# Patient Record
Sex: Female | Born: 1990 | Race: Black or African American | Hispanic: No | Marital: Single | Smoking: Never smoker
Health system: Southern US, Community
[De-identification: ages and names within clinical notes are randomized; demographics above are authoritative.]

## PROBLEM LIST (undated history)

## (undated) ENCOUNTER — Inpatient Hospital Stay (HOSPITAL_COMMUNITY): Payer: Self-pay

## (undated) ENCOUNTER — Ambulatory Visit (HOSPITAL_COMMUNITY): Disposition: A | Discharge status: Home / Self Care | Payer: Medicaid Other

## (undated) DIAGNOSIS — F410 Panic disorder [episodic paroxysmal anxiety] without agoraphobia: Secondary | ICD-10-CM

## (undated) DIAGNOSIS — K219 Gastro-esophageal reflux disease without esophagitis: Secondary | ICD-10-CM

## (undated) DIAGNOSIS — A549 Gonococcal infection, unspecified: Secondary | ICD-10-CM

## (undated) DIAGNOSIS — T8859XA Other complications of anesthesia, initial encounter: Secondary | ICD-10-CM

## (undated) DIAGNOSIS — D649 Anemia, unspecified: Secondary | ICD-10-CM

## (undated) DIAGNOSIS — K5900 Constipation, unspecified: Secondary | ICD-10-CM

## (undated) DIAGNOSIS — F419 Anxiety disorder, unspecified: Secondary | ICD-10-CM

## (undated) HISTORY — PX: WISDOM TOOTH EXTRACTION: SHX21

---
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
## 2009-04-17 ENCOUNTER — Ambulatory Visit (HOSPITAL_COMMUNITY): Admission: RE | Admit: 2009-04-17 | Discharge: 2009-04-17 | Payer: Self-pay | Admitting: Obstetrics

## 2009-09-15 ENCOUNTER — Inpatient Hospital Stay (HOSPITAL_COMMUNITY): Admission: AD | Admit: 2009-09-15 | Discharge: 2009-09-18 | Payer: Self-pay | Admitting: Obstetrics

## 2010-05-07 ENCOUNTER — Emergency Department (HOSPITAL_COMMUNITY)
Admission: EM | Admit: 2010-05-07 | Discharge: 2010-05-07 | Payer: Self-pay | Source: Home / Self Care | Admitting: Emergency Medicine

## 2010-07-13 LAB — CBC
HCT: 30.8 % — ABNORMAL LOW (ref 36.0–46.0)
Hemoglobin: 10.6 g/dL — ABNORMAL LOW (ref 12.0–15.0)
MCHC: 33 g/dL (ref 30.0–36.0)
MCV: 73.7 fL — ABNORMAL LOW (ref 78.0–100.0)
RDW: 20 % — ABNORMAL HIGH (ref 11.5–15.5)
WBC: 13.2 10*3/uL — ABNORMAL HIGH (ref 4.0–10.5)

## 2010-07-17 ENCOUNTER — Inpatient Hospital Stay (HOSPITAL_COMMUNITY)
Admission: RE | Admit: 2010-07-17 | Discharge: 2010-07-17 | Disposition: A | Discharge status: Home / Self Care | Payer: Self-pay | Source: Ambulatory Visit | Attending: Family Medicine | Admitting: Family Medicine

## 2010-08-03 LAB — CBC
HCT: 34 % — ABNORMAL LOW (ref 36.0–46.0)
MCHC: 32.4 g/dL (ref 30.0–36.0)
Platelets: 366 10*3/uL (ref 150–400)

## 2010-08-03 LAB — POCT PREGNANCY, URINE: Preg Test, Ur: POSITIVE

## 2010-08-03 LAB — ABO/RH: ABO/RH(D): A POS

## 2010-08-03 LAB — HCG, QUANTITATIVE, PREGNANCY: hCG, Beta Chain, Quant, S: 43028 m[IU]/mL — ABNORMAL HIGH (ref <5)

## 2010-08-03 LAB — GC/CHLAMYDIA PROBE AMP, URINE: Chlamydia, Swab/Urine, PCR: NEGATIVE

## 2010-08-05 LAB — URINE MICROSCOPIC-ADD ON

## 2010-08-05 LAB — URINALYSIS, ROUTINE W REFLEX MICROSCOPIC
Urobilinogen, UA: 0.2 mg/dL (ref 0.0–1.0)
pH: 6.5 (ref 5.0–8.0)

## 2010-09-26 ENCOUNTER — Emergency Department (HOSPITAL_COMMUNITY)
Admission: EM | Admit: 2010-09-26 | Discharge: 2010-09-26 | Disposition: A | Discharge status: Home / Self Care | Payer: Medicaid Other | Attending: Emergency Medicine | Admitting: Emergency Medicine

## 2010-09-26 DIAGNOSIS — L509 Urticaria, unspecified: Secondary | ICD-10-CM | POA: Insufficient documentation

## 2010-11-30 ENCOUNTER — Emergency Department (HOSPITAL_COMMUNITY)
Admission: EM | Admit: 2010-11-30 | Discharge: 2010-11-30 | Discharge status: Against Medical Advice | Payer: Medicaid Other | Attending: Emergency Medicine | Admitting: Emergency Medicine

## 2010-11-30 DIAGNOSIS — L509 Urticaria, unspecified: Secondary | ICD-10-CM | POA: Insufficient documentation

## 2010-12-02 ENCOUNTER — Emergency Department (HOSPITAL_COMMUNITY)
Admission: EM | Admit: 2010-12-02 | Discharge: 2010-12-02 | Disposition: A | Discharge status: Home / Self Care | Payer: Medicaid Other | Attending: Emergency Medicine | Admitting: Emergency Medicine

## 2010-12-02 DIAGNOSIS — R609 Edema, unspecified: Secondary | ICD-10-CM | POA: Insufficient documentation

## 2010-12-02 DIAGNOSIS — M7989 Other specified soft tissue disorders: Secondary | ICD-10-CM | POA: Insufficient documentation

## 2010-12-02 DIAGNOSIS — M79609 Pain in unspecified limb: Secondary | ICD-10-CM | POA: Insufficient documentation

## 2010-12-02 LAB — POCT I-STAT, CHEM 8
BUN: 7 mg/dL (ref 6–23)
Creatinine, Ser: 0.8 mg/dL (ref 0.50–1.10)
Sodium: 140 mEq/L (ref 135–145)

## 2010-12-03 ENCOUNTER — Ambulatory Visit (HOSPITAL_COMMUNITY)
Admission: RE | Admit: 2010-12-03 | Discharge: 2010-12-03 | Disposition: A | Discharge status: Home / Self Care | Payer: Medicaid Other | Source: Ambulatory Visit | Attending: Emergency Medicine | Admitting: Emergency Medicine

## 2010-12-03 DIAGNOSIS — M79609 Pain in unspecified limb: Secondary | ICD-10-CM

## 2011-01-19 LAB — POCT URINALYSIS DIP (DEVICE)
Glucose, UA: NEGATIVE
Hgb urine dipstick: NEGATIVE
Ketones, ur: NEGATIVE
Nitrite: NEGATIVE
Operator id: 24707
Specific Gravity, Urine: 1.02
pH: 7

## 2011-01-29 LAB — RPR: RPR Ser Ql: NONREACTIVE

## 2011-01-29 LAB — CBC
Hemoglobin: 11.3 g/dL — ABNORMAL LOW (ref 12.0–16.0)
MCHC: 32.6 g/dL (ref 31.0–37.0)
MCV: 79.3 fL (ref 78.0–98.0)
RBC: 4.36 MIL/uL (ref 3.80–5.70)
RDW: 17.6 % — ABNORMAL HIGH (ref 11.4–15.5)
WBC: 15.2 10*3/uL — ABNORMAL HIGH (ref 4.5–13.5)

## 2011-04-30 ENCOUNTER — Emergency Department (HOSPITAL_COMMUNITY)
Admission: EM | Admit: 2011-04-30 | Discharge: 2011-04-30 | Disposition: A | Discharge status: Home / Self Care | Payer: Medicaid Other | Attending: Emergency Medicine | Admitting: Emergency Medicine

## 2011-04-30 ENCOUNTER — Emergency Department (HOSPITAL_COMMUNITY): Payer: Medicaid Other

## 2011-04-30 DIAGNOSIS — M545 Low back pain, unspecified: Secondary | ICD-10-CM | POA: Insufficient documentation

## 2011-04-30 DIAGNOSIS — L299 Pruritus, unspecified: Secondary | ICD-10-CM | POA: Insufficient documentation

## 2011-04-30 DIAGNOSIS — M549 Dorsalgia, unspecified: Secondary | ICD-10-CM

## 2011-04-30 DIAGNOSIS — R21 Rash and other nonspecific skin eruption: Secondary | ICD-10-CM | POA: Insufficient documentation

## 2011-04-30 LAB — URINALYSIS, ROUTINE W REFLEX MICROSCOPIC
Bilirubin Urine: NEGATIVE
Hgb urine dipstick: NEGATIVE

## 2011-04-30 LAB — URINE MICROSCOPIC-ADD ON

## 2011-04-30 LAB — PREGNANCY, URINE: Preg Test, Ur: NEGATIVE

## 2011-04-30 MED ORDER — DIPHENHYDRAMINE HCL 25 MG PO CAPS: 25.0000 mg | ORAL_CAPSULE | Freq: Four times a day (QID) | ORAL | Status: AC | PRN

## 2011-04-30 MED ORDER — DIPHENHYDRAMINE HCL 25 MG PO CAPS
ORAL_CAPSULE | ORAL | Status: AC
  Administered 2011-04-30: 18:00:00
  Filled 2011-04-30: qty 2

## 2011-04-30 MED ORDER — IBUPROFEN 600 MG PO TABS: 600.0000 mg | ORAL_TABLET | Freq: Four times a day (QID) | ORAL | Status: AC | PRN

## 2011-04-30 MED ORDER — HYDROCODONE-ACETAMINOPHEN 5-325 MG PO TABS
1.0000 | ORAL_TABLET | Freq: Once | ORAL | Status: AC
  Administered 2011-04-30: 1 via ORAL
  Filled 2011-04-30: qty 1

## 2011-04-30 MED ORDER — HYDROCODONE-ACETAMINOPHEN 5-500 MG PO TABS: 1.0000 | ORAL_TABLET | ORAL | Status: AC | PRN

## 2011-04-30 MED ORDER — DIPHENHYDRAMINE HCL 25 MG PO CAPS: 50.0000 mg | ORAL_CAPSULE | Freq: Once | ORAL | Status: DC

## 2011-04-30 NOTE — ED Notes (Signed)
Pt ambulatory to check out desk stable and in no acute distress with family. Pt not driving.

## 2011-04-30 NOTE — ED Notes (Signed)
Pt presents with low back pain after falling onto outside steps yesterday.  Pt also reports she broke out in hives yesterday, denies any new food or medication.

## 2011-04-30 NOTE — ED Provider Notes (Signed)
History     CSN: [id number]  Arrival date & time 04/30/11  1348   First MD Initiated Contact with Patient 04/30/11 1651      Chief Complaint  Patient presents with  . Back Pain    (Consider location/radiation/quality/duration/timing/severity/associated sxs/prior treatment) HPI  20yoF is a healthy presents with back pain. The patient complains of back pain since yesterday after her fall. She fell down steps and landed on her bottom, striking her back. She has been ambulatory since that time. She denies numbness, tingling, weakness, pain in her extremities. She complains of 8/10 pain in her lower back in the middle. She also states that yesterday she began to experience red pruritic rash in her bilateral upper extremities and right hip. She denies any new food, medications, exposures. No one at home with similar rash. She denies fevers. Denies hematuria/dysuria/freq/urgency.  History reviewed. No pertinent past medical history.  History reviewed. No pertinent past surgical history.  No family history on file.  History  Substance Use Topics  . Smoking status: Not on file  . Smokeless tobacco: Not on file  . Alcohol Use: Not on file    OB History    Grav Para Term Preterm Abortions TAB SAB Ect Mult Living                  Review of Systems  All other systems reviewed and are negative.   except as noted HPI   Allergies  Codeine  Home Medications   Current Outpatient Rx  Name Route Sig Dispense Refill  . IBUPROFEN 800 MG PO TABS Oral Take 800 mg by mouth every 8 (eight) hours as needed. For pain.     Marland Kitchen DIPHENHYDRAMINE HCL 25 MG PO CAPS Oral Take 1 capsule (25 mg total) by mouth every 6 (six) hours as needed for itching. 30 capsule 0  . HYDROCODONE-ACETAMINOPHEN 5-500 MG PO TABS Oral Take 1 tablet by mouth every 4 (four) hours as needed for pain. 15 tablet 0  . IBUPROFEN 600 MG PO TABS Oral Take 1 tablet (600 mg total) by mouth every 6 (six) hours as needed for pain. 30  tablet 0    BP 105/67  Pulse 93  Temp(Src) 97.4 F (36.3 C) (Oral)  Resp 16  SpO2 100%  Physical Exam  Nursing note and vitals reviewed. Constitutional: She is oriented to person, place, and time. She appears well-developed.  HENT:  Head: Atraumatic.  Mouth/Throat: Oropharynx is clear and moist.  Eyes: Conjunctivae and EOM are normal. Pupils are equal, round, and reactive to light.  Neck: Normal range of motion. Neck supple.  Cardiovascular: Normal rate, regular rhythm, normal heart sounds and intact distal pulses.   Pulmonary/Chest: Effort normal and breath sounds normal. No respiratory distress. She has no wheezes. She has no rales.  Abdominal: Soft. She exhibits no distension. There is no tenderness. There is no rebound and no guarding.       No cvat  Musculoskeletal: Normal range of motion.       Midline lower lumbar tenderness to palpation Strength 5/ 5 bilateral upper and lower extremities  Neurological: She is alert and oriented to person, place, and time. She exhibits normal muscle tone. Coordination normal.  Skin: Skin is warm and dry. No rash noted.       B/l UE with raised red lesions  Psychiatric: She has a normal mood and affect.    ED Course  Procedures (including critical care time)  Labs Reviewed  URINALYSIS, ROUTINE  W REFLEX MICROSCOPIC - Abnormal; Notable for the following:    APPearance CLOUDY (*)    Leukocytes, UA LARGE (*)    All other components within normal limits  URINE MICROSCOPIC-ADD ON - Abnormal; Notable for the following:    Squamous Epithelial / LPF MANY (*)    Bacteria, UA FEW (*)    All other components within normal limits  PREGNANCY, URINE  URINE CULTURE   Dg Lumbar Spine Complete  04/30/2011  *RADIOLOGY REPORT*  Clinical Data: Fall, low back pain  LUMBAR SPINE - COMPLETE 4+ VIEW  Comparison: None.  Findings: Five lumbar-type vertebral bodies.  Normal lumbar lordosis.  No evidence of fracture or dislocation.  Vertebral body heights and  intervertebral disc spaces are maintained.  IMPRESSION: Normal lumbar spine radiographs.  Original Report Authenticated By: Charline Bills, M.D.     1. Back pain   2. Rash      MDM  This was mechanical fall with lower back pain. We'll obtain an x-ray to evaluate. Patient given Vicodin here for pain. Likely home with same and ibuprofen. For rash looks like insect bites. There is no superimposed infection at this time. Patient given Benadryl for return. She does not complaining of UTI symptoms and has no CVA tenderness. Her urinalysis is contaminated. Will send for culture. Hold antibiotics for now.   XR reviewed and unremarkable. Home with plan as above.      Forbes Cellar, MD 04/30/11 [phone]

## 2011-04-30 NOTE — ED Notes (Signed)
Pt reports itching and pain are improved. Pt noted ambulating about hallway. No acute distress is noted.

## 2012-02-06 ENCOUNTER — Emergency Department (HOSPITAL_COMMUNITY)
Admission: EM | Admit: 2012-02-06 | Discharge: 2012-02-06 | Disposition: A | Discharge status: Home / Self Care | Payer: Self-pay | Attending: Emergency Medicine | Admitting: Emergency Medicine

## 2012-02-06 ENCOUNTER — Emergency Department (HOSPITAL_COMMUNITY): Payer: Self-pay

## 2012-02-06 ENCOUNTER — Encounter (HOSPITAL_COMMUNITY): Payer: Self-pay | Admitting: Emergency Medicine

## 2012-02-06 DIAGNOSIS — X500XXA Overexertion from strenuous movement or load, initial encounter: Secondary | ICD-10-CM | POA: Insufficient documentation

## 2012-02-06 DIAGNOSIS — IMO0002 Reserved for concepts with insufficient information to code with codable children: Secondary | ICD-10-CM | POA: Insufficient documentation

## 2012-02-06 DIAGNOSIS — M2390 Unspecified internal derangement of unspecified knee: Secondary | ICD-10-CM

## 2012-02-06 DIAGNOSIS — M25569 Pain in unspecified knee: Secondary | ICD-10-CM | POA: Insufficient documentation

## 2012-02-06 MED ORDER — IBUPROFEN 400 MG PO TABS
800.0000 mg | ORAL_TABLET | Freq: Once | ORAL | Status: AC
  Administered 2012-02-06: 800 mg via ORAL
  Filled 2012-02-06: qty 2

## 2012-02-06 NOTE — ED Notes (Signed)
The pt returned from xray.  Waiting for a disposition

## 2012-02-06 NOTE — ED Notes (Signed)
Pt states she fell on L knee 2 months ago and has had L knee pain since fall.

## 2012-02-06 NOTE — ED Notes (Signed)
Discharge delayed the ortho tech is busy

## 2012-02-06 NOTE — ED Provider Notes (Signed)
History     CSN: [id number]  Arrival date & time 02/06/12  1931   First MD Initiated Contact with Patient 02/06/12 1957      Chief Complaint  Patient presents with  . Knee Pain    (Consider location/radiation/quality/duration/timing/severity/associated sxs/prior treatment) HPI Comments: 21 year old female presents the emergency department complaining of left knee pain after falling on it 2 months ago. She states she was holding her baby, when she slipped on water and "twisted" her knee and fell. Since then she has had constant pain, however she thought it was a pulled muscle. Describes pain as throbbing and sharp at times, rated 8/10 worse with bending and to cross her legs. States she is walking with a limp. Denies any numbness or tingling down her leg. No swelling, bruising or color change  Patient is a 21 y.o. female presenting with knee pain. The history is provided by the patient.  Knee Pain Associated symptoms include arthralgias (left knee pain). Pertinent negatives include no joint swelling, neck pain or numbness.    History reviewed. No pertinent past medical history.  History reviewed. No pertinent past surgical history.  No family history on file.  History  Substance Use Topics  . Smoking status: Never Smoker   . Smokeless tobacco: Not on file  . Alcohol Use: No    OB History    Grav Para Term Preterm Abortions TAB SAB Ect Mult Living                  Review of Systems  Constitutional: Negative for activity change.  HENT: Negative for neck pain.   Musculoskeletal: Positive for arthralgias (left knee pain) and gait problem. Negative for joint swelling.  Skin: Negative for color change.  Neurological: Negative for numbness.    Allergies  Codeine  Home Medications  No current outpatient prescriptions on file.  BP 111/70  Pulse 72  Temp 98.2 F (36.8 C) (Oral)  Resp 16  SpO2 100%  Physical Exam  Nursing note and vitals reviewed. Constitutional:  She is oriented to person, place, and time. She appears well-developed and well-nourished. No distress.  HENT:  Head: Normocephalic and atraumatic.  Eyes: Conjunctivae normal and EOM are normal.  Neck: Normal range of motion.  Cardiovascular: Normal rate, normal heart sounds and intact distal pulses.   Pulmonary/Chest: Effort normal and breath sounds normal.  Musculoskeletal:       Left knee: She exhibits decreased range of motion (very mild), bony tenderness (medially) and abnormal meniscus (positive McMurray's). She exhibits no swelling, no ecchymosis, no deformity and normal alignment. tenderness found. Medial joint line tenderness noted.  Neurological: She is alert and oriented to person, place, and time. She has normal strength. No sensory deficit. Gait (limping) abnormal.  Skin: Skin is warm. No bruising and no ecchymosis noted.  Psychiatric: She has a normal mood and affect. Her behavior is normal.    ED Course  Procedures (including critical care time)  Labs Reviewed - No data to display Dg Knee Complete 4 Views Left  02/06/2012  *RADIOLOGY REPORT*  Clinical Data: The patient fell 2 months ago, hurting the left knee.  Posterior knee pain.  LEFT KNEE - COMPLETE 4+ VIEW  Comparison: None.  Findings: The left knee appears intact. No evidence of acute fracture or subluxation.  No focal bone lesions.  Bone matrix and cortex appear intact.  No abnormal radiopaque densities in the soft tissues.  No significant effusion.  IMPRESSION: No acute bony abnormalities.   Original  Report Authenticated By: Marlon Pel, M.D.      1. Internal derangement of knee       MDM  21 year old female with left knee pain for the past 2 months. She has a positive McMurray sign on her exam concerning meniscal injury. I will provide a knee immobilizer and crutches and have her followup with orthopedics. RICE instructions given.        Trevor Mace, PA-C 02/06/12 2130

## 2012-02-06 NOTE — Progress Notes (Signed)
Orthopedic Tech Progress Note Patient Details:  Jaclyn Tyler 24-Feb-1991 [id number]  Ortho Devices Type of Ortho Device: Knee Immobilizer;Crutches Ortho Device/Splint Location: left knee Ortho Device/Splint Interventions: Application   [name] 02/06/2012, 10:03 PM

## 2012-02-06 NOTE — ED Notes (Signed)
The pt has had pain in her lt knee for 2-3 days.  She injured it  2-3 months ago but no pain until she started working yesterday and today that is when the pain started

## 2012-02-07 NOTE — ED Provider Notes (Signed)
Medical screening examination/treatment/procedure(s) were performed by non-physician practitioner and as supervising physician I was immediately available for consultation/collaboration. Devoria Albe, MD, Armando Gang   Ward Givens, MD 02/07/12 Moses Manners

## 2013-04-20 ENCOUNTER — Emergency Department (HOSPITAL_COMMUNITY)
Admission: EM | Admit: 2013-04-20 | Discharge: 2013-04-20 | Disposition: A | Discharge status: Home / Self Care | Payer: Medicaid Other | Attending: Emergency Medicine | Admitting: Emergency Medicine

## 2013-04-20 ENCOUNTER — Encounter (HOSPITAL_COMMUNITY): Payer: Self-pay | Admitting: Emergency Medicine

## 2013-04-20 DIAGNOSIS — Y990 Civilian activity done for income or pay: Secondary | ICD-10-CM | POA: Insufficient documentation

## 2013-04-20 DIAGNOSIS — Y9389 Activity, other specified: Secondary | ICD-10-CM | POA: Insufficient documentation

## 2013-04-20 DIAGNOSIS — S8990XA Unspecified injury of unspecified lower leg, initial encounter: Secondary | ICD-10-CM | POA: Insufficient documentation

## 2013-04-20 DIAGNOSIS — IMO0002 Reserved for concepts with insufficient information to code with codable children: Secondary | ICD-10-CM | POA: Insufficient documentation

## 2013-04-20 DIAGNOSIS — Y9289 Other specified places as the place of occurrence of the external cause: Secondary | ICD-10-CM | POA: Insufficient documentation

## 2013-04-20 DIAGNOSIS — M25562 Pain in left knee: Secondary | ICD-10-CM

## 2013-04-20 DIAGNOSIS — X500XXA Overexertion from strenuous movement or load, initial encounter: Secondary | ICD-10-CM | POA: Insufficient documentation

## 2013-04-20 DIAGNOSIS — M549 Dorsalgia, unspecified: Secondary | ICD-10-CM

## 2013-04-20 MED ORDER — MELOXICAM 7.5 MG PO TABS: 7.5000 mg | ORAL_TABLET | Freq: Every day | ORAL | Status: DC

## 2013-04-20 NOTE — ED Provider Notes (Signed)
CSN: [id number]     Arrival date & time 04/20/13  0035 History   First MD Initiated Contact with Patient 04/20/13 0046     Chief Complaint  Patient presents with  . Back Pain   (Consider location/radiation/quality/duration/timing/severity/associated sxs/prior Treatment) HPI Comments: Patient is a 22 year old female with history of chronic left knee pain who presents to the emergency department tonight for one week of worsening back pain. She additionally complains of the pain in her left knee. Her back pain is sharp in nature and does not radiate. 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 The pain is worse when she bends over. She has not tried anything for her pain because she "doesn't like pills". She has not tried ice or heat on her back. Her left knee pain is 8 stiffness. She's been seen by orthopedics who recommended arthroscopic surgery. She has declined surgery. She tried a brace with little relief. This is the same pain as her chronic pain for the past 2 years. She denies any fever, chills, numbness, weakness, paresthesias, bowel or bladder incontinence, history of cancer, drug use.  Patient is a 22 y.o. female presenting with back pain. The history is provided by the patient. No language interpreter was used.  Back Pain Associated symptoms: no abdominal pain, no chest pain, no fever, no numbness and no weakness     History reviewed. No pertinent past medical history. History reviewed. No pertinent past surgical history. History reviewed. No pertinent family history. History  Substance Use Topics  . Smoking status: Never Smoker   . Smokeless tobacco: Not on file  . Alcohol Use: Yes   OB History   Grav Para Term Preterm Abortions TAB SAB Ect Mult Living                 Review of Systems  Constitutional: Negative for fever and chills.  Respiratory: Negative for shortness of breath.   Cardiovascular: Negative for chest pain.    Gastrointestinal: Negative for nausea, vomiting and abdominal pain.  Musculoskeletal: Positive for arthralgias, back pain, gait problem, joint swelling and myalgias.  Neurological: Negative for weakness and numbness.  All other systems reviewed and are negative.    Allergies  Codeine  Home Medications   Current Outpatient Rx  Name  Route  Sig  Dispense  Refill  . meloxicam (MOBIC) 7.5 MG tablet   Oral   Take 1 tablet (7.5 mg total) by mouth daily.   15 tablet   0    BP 115/63  Pulse 90  Temp(Src) 98.2 F (36.8 C) (Oral)  Resp 18  SpO2 100% Physical Exam  Nursing note and vitals reviewed. Constitutional: She is oriented to person, place, and time. She appears well-developed and well-nourished. No distress.  HENT:  Head: Normocephalic and atraumatic.  Right Ear: External ear normal.  Left Ear: External ear normal.  Nose: Nose normal.  Mouth/Throat: Oropharynx is clear and moist.  Eyes: Conjunctivae are normal.  Neck: Normal range of motion.  Cardiovascular: Normal rate, regular rhythm, normal heart sounds, intact distal pulses and normal pulses.   Pulses:      Dorsalis pedis pulses are 2+ on the right side, and 2+ on the left side.       Posterior tibial pulses are 2+ on the right side, and 2+ on the left side.  Pulmonary/Chest: Effort normal and breath sounds normal. No stridor. No respiratory distress. She has no wheezes. She has no rales.  Abdominal: Soft. She exhibits no distension.  Musculoskeletal: Normal range of motion.       Back:  straight leg raise negative bilaterally. Range of motion of left knee limited due to pain. Diffusely tender over left knee. No erythema, swelling. Compartment soft, neurovascularly intact.  Neurological: She is alert and oriented to person, place, and time. She has normal strength.  Skin: Skin is warm and dry. She is not diaphoretic. No erythema.  Psychiatric: She has a normal mood and affect. Her behavior is normal.    ED  Course  Procedures (including critical care time) Labs Review Labs Reviewed - No data to display Imaging Review No results found.  EKG Interpretation   None       MDM   1. Back pain   2. Left knee pain    Patient with back pain.  No neurological deficits and normal neuro exam.  Patient can walk but states is painful.  No loss of bowel or bladder control.  No concern for cauda equina.  No fever, night sweats, weight loss, h/o cancer, IVDU.  RICE protocol and pain medicine indicated and discussed with patient.      Mora Bellman, PA-C 04/20/13 [phone]

## 2013-04-20 NOTE — ED Notes (Signed)
Pt reports left knee pain x 1.5 yrs. Was seen in the ED and referred to an orthopedist and was told that she needed surgery but chose not to have it due to not wanting scars on her knee. Pt reports lower back pain x 1 week. Reports lifting heavy objects at work and straining her back.

## 2013-04-20 NOTE — ED Provider Notes (Signed)
Medical screening examination/treatment/procedure(s) were performed by non-physician practitioner and as supervising physician I was immediately available for consultation/collaboration.     Brandt Loosen, MD 04/20/13 [phone]

## 2013-04-20 NOTE — ED Notes (Signed)
Worsening back pain while at work. X 1 week of back pain. Knee pain for a year.

## 2013-05-09 ENCOUNTER — Inpatient Hospital Stay (HOSPITAL_COMMUNITY): Payer: Medicaid Other

## 2013-05-09 ENCOUNTER — Encounter (HOSPITAL_COMMUNITY): Payer: Self-pay | Admitting: *Deleted

## 2013-05-09 ENCOUNTER — Inpatient Hospital Stay (HOSPITAL_COMMUNITY)
Admission: AD | Admit: 2013-05-09 | Discharge: 2013-05-09 | Disposition: A | Discharge status: Home / Self Care | Payer: Medicaid Other | Source: Ambulatory Visit | Attending: Obstetrics and Gynecology | Admitting: Obstetrics and Gynecology

## 2013-05-09 DIAGNOSIS — O200 Threatened abortion: Secondary | ICD-10-CM | POA: Insufficient documentation

## 2013-05-09 DIAGNOSIS — R109 Unspecified abdominal pain: Secondary | ICD-10-CM | POA: Insufficient documentation

## 2013-05-09 DIAGNOSIS — O209 Hemorrhage in early pregnancy, unspecified: Secondary | ICD-10-CM

## 2013-05-09 HISTORY — DX: Gonococcal infection, unspecified: A54.9

## 2013-05-09 HISTORY — DX: Constipation, unspecified: K59.00

## 2013-05-09 LAB — WET PREP, GENITAL
Clue Cells Wet Prep HPF POC: NONE SEEN
Trich, Wet Prep: NONE SEEN
YEAST WET PREP: NONE SEEN

## 2013-05-09 LAB — CBC
HCT: 34.2 % — ABNORMAL LOW (ref 36.0–46.0)
Hemoglobin: 10.9 g/dL — ABNORMAL LOW (ref 12.0–15.0)
MCH: 21.5 pg — ABNORMAL LOW (ref 26.0–34.0)
MCHC: 31.9 g/dL (ref 30.0–36.0)
MCV: 67.5 fL — AB (ref 78.0–100.0)
PLATELETS: 427 10*3/uL — AB (ref 150–400)
RBC: 5.07 MIL/uL (ref 3.87–5.11)
RDW: 20.1 % — ABNORMAL HIGH (ref 11.5–15.5)
WBC: 15.5 10*3/uL — AB (ref 4.0–10.5)

## 2013-05-09 LAB — POCT PREGNANCY, URINE: PREG TEST UR: POSITIVE — AB

## 2013-05-09 LAB — URINALYSIS, ROUTINE W REFLEX MICROSCOPIC
BILIRUBIN URINE: NEGATIVE
Glucose, UA: NEGATIVE mg/dL
Ketones, ur: NEGATIVE mg/dL
Leukocytes, UA: NEGATIVE
NITRITE: NEGATIVE
PH: 7 (ref 5.0–8.0)
Protein, ur: NEGATIVE mg/dL
SPECIFIC GRAVITY, URINE: 1.01 (ref 1.005–1.030)
Urobilinogen, UA: 0.2 mg/dL (ref 0.0–1.0)

## 2013-05-09 LAB — URINE MICROSCOPIC-ADD ON

## 2013-05-09 LAB — HCG, QUANTITATIVE, PREGNANCY: HCG, BETA CHAIN, QUANT, S: 514 m[IU]/mL — AB (ref <5)

## 2013-05-09 LAB — ABO/RH: ABO/RH(D): A POS

## 2013-05-09 NOTE — MAU Note (Signed)
Pt presents with complaints of vaginal bleeding with some lower abdominal cramping and she had a positive pregnancy test 2 or 3 weeks ago

## 2013-05-09 NOTE — Discharge Instructions (Signed)
Vaginal Bleeding During Pregnancy, First Trimester °A small amount of bleeding (spotting) from the vagina is relatively common in early pregnancy. It usually stops on its own. Various things may cause bleeding or spotting in early pregnancy. Some bleeding may be related to the pregnancy, and some may not. In most cases, the bleeding is normal and is not a problem. However, bleeding can also be a sign of something serious. Be sure to tell your health care provider about any vaginal bleeding right away. °Some possible causes of vaginal bleeding during the first trimester include: °· Infection or inflammation of the cervix. °· Growths (polyps) on the cervix. °· Miscarriage or threatened miscarriage. °· Pregnancy tissue has developed outside of the uterus and in a fallopian tube (tubal pregnancy). °· Tiny cysts have developed in the uterus instead of pregnancy tissue (molar pregnancy). °HOME CARE INSTRUCTIONS  °Watch your condition for any changes. The following actions may help to lessen any discomfort you are feeling: °· Follow your health care provider's instructions for limiting your activity. If your health care provider orders bed rest, you may need to stay in bed and only get up to use the bathroom. However, your health care provider may allow you to continue light activity. °· If needed, make plans for someone to help with your regular activities and responsibilities while you are on bed rest. °· Keep track of the number of pads you use each day, how often you change pads, and how soaked (saturated) they are. Write this down. °· Do not use tampons. Do not douche. °· Do not have sexual intercourse or orgasms until approved by your health care provider. °· If you pass any tissue from your vagina, save the tissue so you can show it to your health care provider. °· Only take over-the-counter or prescription medicines as directed by your health care provider. °· Do not take aspirin because it can make you  bleed. °· Keep all follow-up appointments as directed by your health care provider. °SEEK MEDICAL CARE IF: °· You have any vaginal bleeding during any part of your pregnancy. °· You have cramps or labor pains. °SEEK IMMEDIATE MEDICAL CARE IF:  °· You have severe cramps in your back or belly (abdomen). °· You have a fever, not controlled by medicine. °· You pass large clots or tissue from your vagina. °· Your bleeding increases. °· You feel lightheaded or weak, or you have fainting episodes. °· You have chills. °· You are leaking fluid or have a gush of fluid from your vagina. °· You pass out while having a bowel movement. °MAKE SURE YOU: °· Understand these instructions. °· Will watch your condition. °· Will get help right away if you are not doing well or get worse. °Document Released: 01/20/2005 Document Revised: 01/31/2013 Document Reviewed: 12/18/2012 °[employer]® Patient Information ©2014 [employer]. ° °

## 2013-05-09 NOTE — MAU Provider Note (Signed)
History     CSN: [id number]  Arrival date and time: 05/09/13 [id number]   First Provider Initiated Contact with Patient 05/09/13 [id number]      Chief Complaint  Patient presents with  . Vaginal Bleeding   HPI  Jaclyn Tyler is a [age] yof [id number] who presents to the MAU for vaginal bleeding that onset yesterday.  Pt states that while using the bathroom at work and noticed blood on the toilet tissue after she wiped.  She went home later that night and the vaginal bleed had gotten worse and began to run down her leg.  States the blood was dark red and contained small clots.  She reports associated constant, lower abdominal pain that feels like menstrual cramping that onset this am.  States that she took about 5 different pregnancy tests in the last 6 weeks, the last being 3 weeks ago, that were all positive.    OB History   Grav Para Term Preterm Abortions TAB SAB Ect Mult Living   4 2 2  1  1   2       Past Medical History  Diagnosis Date  . Gonorrhea   . Constipation     Past Surgical History  Procedure Laterality Date  . Wisdom tooth extraction      History reviewed. No pertinent family history.  History  Substance Use Topics  . Smoking status: Never Smoker   . Smokeless tobacco: Not on file  . Alcohol Use: Yes    Allergies:  Allergies  Allergen Reactions  . Codeine Hives, Itching and Rash    Prescriptions prior to admission  Medication Sig Dispense Refill  . meloxicam (MOBIC) 7.5 MG tablet Take 1 tablet (7.5 mg total) by mouth daily.  15 tablet  0    Review of Systems  Constitutional: Negative for fever and chills.  Gastrointestinal: Positive for abdominal pain. Negative for nausea, vomiting, diarrhea and constipation.  Genitourinary: Negative for dysuria, urgency and frequency.   Physical Exam   Blood pressure 149/77, pulse 89, temperature 97.9 F (36.6 C), resp. rate 16, height 6' (1.829 m), weight 102.967 kg (227 lb), last menstrual period  03/23/2013.  Physical Exam  Constitutional: She appears well-developed and well-nourished.  GI: Soft. There is tenderness in the right lower quadrant and left lower quadrant.  Genitourinary:  Speculum exam:  Vagina: Blood (moderate) pooling, no erythema, no lesions noted.  Cervix:  Actively bleeding from the os. No erythema, no lesions noted externally.  Bi-manual exam  Cervix:  No CMT, os open (fingertip) Uterus:  No tenderness noted Adnexal: No tenderness bilaterally Wet prep and GC/Chlam collected. Chaperone present for the exam.    MAU Course  Procedures  [place] Ob Comp Less 14 Wks  05/09/2013   CLINICAL DATA:  Pregnant, vaginal bleeding; no quantitative beta HCG available for correlation  EXAM: OBSTETRIC <14 WK US AND TRANSVAGINAL OB US  TECHNIQUE: Both transabdominal and transvaginal ultrasound examinations were performed for complete evaluation of the gestation as well as the maternal uterus, adnexal regions, and pelvic cul-de-sac. Transvaginal technique was performed to assess early pregnancy.  COMPARISON:  None for this pregnancy  FINDINGS: Intrauterine gestational sac: Not identified  Yolk sac:  N/A  Embryo:  N/A  Cardiac Activity: N/A  Heart Rate:  N/A bpm  Maternal uterus/adnexae:  No focal uterine mass, fluid collection or gestational sac seen.  Left ovary normal size and morphology 1.6 x 2.1 x 2.4 cm.  Right ovary not visualized on either transabdominal or endovaginal  imaging, question related to obscuration by bowel.  Trace free pelvic fluid.  No adnexal masses.  IMPRESSION: No intrauterine or extrauterine gestational sac identified.  In the setting of a positive pregnancy test and nonvisualization of an intrauterine pregnancy, differential diagnosis includes early intrauterine pregnancy too early to visualize, spontaneous abortion, and ectopic pregnancy.  Serial quantitative beta HCG and or follow-up ultrasound recommended to definitively exclude ectopic pregnancy.    Electronically Signed   By: Ulyses Southward M.D.   On: 05/09/2013 11:27   US Ob Transvaginal  05/09/2013   CLINICAL DATA:  Pregnant, vaginal bleeding; no quantitative beta HCG available for correlation  EXAM: OBSTETRIC <14 WK [place] AND TRANSVAGINAL OB US  TECHNIQUE: Both transabdominal and transvaginal ultrasound examinations were performed for complete evaluation of the gestation as well as the maternal uterus, adnexal regions, and pelvic cul-de-sac. Transvaginal technique was performed to assess early pregnancy.  COMPARISON:  None for this pregnancy  FINDINGS: Intrauterine gestational sac: Not identified  Yolk sac:  N/A  Embryo:  N/A  Cardiac Activity: N/A  Heart Rate:  N/A bpm  Maternal uterus/adnexae:  No focal uterine mass, fluid collection or gestational sac seen.  Left ovary normal size and morphology 1.6 x 2.1 x 2.4 cm.  Right ovary not visualized on either transabdominal or endovaginal imaging, question related to obscuration by bowel.  Trace free pelvic fluid.  No adnexal masses.  IMPRESSION: No intrauterine or extrauterine gestational sac identified.  In the setting of a positive pregnancy test and nonvisualization of an intrauterine pregnancy, differential diagnosis includes early intrauterine pregnancy too early to visualize, spontaneous abortion, and ectopic pregnancy.  Serial quantitative beta HCG and or follow-up ultrasound recommended to definitively exclude ectopic pregnancy.   Electronically Signed   By: Ulyses Southward M.D.   On: 05/09/2013 11:27    MDM  Speculum exam Bi-manual exam CBC ABO blood type US OB complete less than 14 wks US OB transvaginal HCG quantitative   Assessment and Plan   Assessment:  #Vaginal bleeding in pregnant patient at less than [redacted] weeks gestation-The obstetric differential includes ectopic pregnancy, spontaneous abortion and early pregnancy; too early to be visualized on ultrasound.  As all of the diagnoses are possible it is imperative that the patient  returns in 48 hours to have a quantitative hCG taken to assess which diagnosis will be more likely.  If the quantitative hCG has nearly doubled in 48 hrs a normal pregnancy too early for visualization is likely, if the quantitative hCG remains the same or lowers ectopic pregnancy and spontaneous abortion is likely and will warrant further work up.  Gynecological differential includes infection and/or mechanical stress in the vagina.  With no white count, negative wet prep and a uterus source for the vaginal bleeding found on speculum exam these etiologies are unlikely.  Plan:  1. Discharge patient in stable condition 2. Follow up in 48 hrs to have quantitative hCG rechecked 3. Return to the MAU if symptoms persist and/or worsen   Toilolo, Tifi 05/09/2013, 9:35 AM   I have seen this patient and agree with the above resident's note.  LEFTWICH-KIRBY, Deneisha Dade Certified Nurse-Midwife

## 2013-05-10 LAB — GC/CHLAMYDIA PROBE AMP
CT PROBE, AMP APTIMA: NEGATIVE
GC Probe RNA: NEGATIVE

## 2013-05-11 ENCOUNTER — Encounter (HOSPITAL_COMMUNITY): Payer: Self-pay | Admitting: *Deleted

## 2013-05-11 ENCOUNTER — Inpatient Hospital Stay (HOSPITAL_COMMUNITY)
Admission: AD | Admit: 2013-05-11 | Discharge: 2013-05-11 | Disposition: A | Discharge status: Home / Self Care | Payer: Medicaid Other | Source: Ambulatory Visit | Attending: Obstetrics | Admitting: Obstetrics

## 2013-05-11 DIAGNOSIS — O039 Complete or unspecified spontaneous abortion without complication: Secondary | ICD-10-CM | POA: Insufficient documentation

## 2013-05-11 LAB — HCG, QUANTITATIVE, PREGNANCY: hCG, Beta Chain, Quant, S: 88 m[IU]/mL — ABNORMAL HIGH (ref <5)

## 2013-05-11 LAB — CBC
HCT: 32.6 % — ABNORMAL LOW (ref 36.0–46.0)
HEMOGLOBIN: 10.1 g/dL — AB (ref 12.0–15.0)
MCH: 21.3 pg — AB (ref 26.0–34.0)
MCHC: 31 g/dL (ref 30.0–36.0)
MCV: 68.8 fL — ABNORMAL LOW (ref 78.0–100.0)
PLATELETS: 386 10*3/uL (ref 150–400)
RBC: 4.74 MIL/uL (ref 3.87–5.11)
RDW: 18.5 % — ABNORMAL HIGH (ref 11.5–15.5)
WBC: 12.1 10*3/uL — AB (ref 4.0–10.5)

## 2013-05-11 NOTE — MAU Provider Note (Signed)
  History     CSN: [id number]  Arrival date and time: 05/11/13 [id number]   First Provider Initiated Contact with Patient 05/11/13 1904      No chief complaint on file.  HPI Comments: Jaclyn Tyler 23 y.o. [id number] presents to MAU for follow up on her BHCG. She was 514 on 05/09/13. She has been bleeding heavily since then. States she is changing pad x times per hour. She is also passing large clots. She declines anything for pain as she does not like to take medications. She has not seen Dr Gaynell FaceMarshall in several years.      Past Medical History  Diagnosis Date  . Gonorrhea   . Constipation     Past Surgical History  Procedure Laterality Date  . Wisdom tooth extraction      History reviewed. No pertinent family history.  History  Substance Use Topics  . Smoking status: Never Smoker   . Smokeless tobacco: Not on file  . Alcohol Use: Yes    Allergies: No Known Allergies  No prescriptions prior to admission    Review of Systems  Respiratory: Negative.   Cardiovascular: Negative.   Gastrointestinal: Negative.   Genitourinary:       Uterus pain and bleeding  Musculoskeletal: Negative.   Neurological: Negative.   Psychiatric/Behavioral: Negative.    Physical Exam   Blood pressure 128/73, pulse 74, temperature 99.1 F (37.3 C), temperature source Oral, resp. rate 18, height 5\' 10"  (1.778 m), weight 103.874 kg (229 lb), last menstrual period 03/23/2013.  Physical Exam  Constitutional: She is oriented to person, place, and time. She appears well-developed and well-nourished. No distress.  HENT:  Head: Normocephalic and atraumatic.  Genitourinary:  Genitalia: External; Negative Vaginal: small amount blood Cervix: open finger tip Biman: Uterus tender  Neurological: She is alert and oriented to person, place, and time.  Skin: Skin is warm.  Psychiatric: She has a normal mood and affect.   Results for orders placed during the hospital encounter of 05/11/13 (from the  past 24 hour(s))  HCG, QUANTITATIVE, PREGNANCY     Status: Abnormal   Collection Time    05/11/13  6:30 PM      Result Value Range   hCG, Beta Chain, Quant, S 88 (*) <5 mIU/mL  CBC     Status: Abnormal   Collection Time    05/11/13  6:30 PM      Result Value Range   WBC 12.1 (*) 4.0 - 10.5 K/uL   RBC 4.74  3.87 - 5.11 MIL/uL   Hemoglobin 10.1 (*) 12.0 - 15.0 g/dL   HCT 29.532.6 (*) 62.136.0 - 30.846.0 %   MCV 68.8 (*) 78.0 - 100.0 fL   MCH 21.3 (*) 26.0 - 34.0 pg   MCHC 31.0  30.0 - 36.0 g/dL   RDW 65.718.5 (*) 84.611.5 - 96.215.5 %   Platelets 386  150 - 400 K/uL     MAU Course  Procedures  MDM Repeat BHCG  Assessment and Plan   A: SAB  P: Pt declines any pain medication Note to be out of work tomorrow Repeat BHCG on Sunday Advised pelvic rest  Jaclyn Tyler, Jaclyn Tyler 05/11/2013, 7:20 PM

## 2013-05-11 NOTE — MAU Note (Signed)
Was here a couple days ago, was to return for blood work  This morning.  Was too tired when got off work so went home.  Feels weak and light headed.

## 2013-05-11 NOTE — Discharge Instructions (Signed)
Miscarriage  A miscarriage is the loss of an unborn baby (fetus) before the 20th week of pregnancy. The cause is often unknown.   HOME CARE  · You may need to stay in bed (bed rest), or you may be able to do light activity. Go about activity as told by your doctor.  · Have help at home.  · Write down how many pads you use each day. Write down how soaked they are.  · Do not use tampons. Do not wash out your vagina (douche) or have sex (intercourse) until your doctor approves.  · Only take medicine as told by your doctor.  · Do not take aspirin.  · Keep all doctor visits as told.  · If you or your partner have problems with grieving, talk to your doctor. You can also try counseling. Give yourself time to grieve before trying to get pregnant again.  GET HELP RIGHT AWAY IF:  · You have bad cramps or pain in your back or belly (abdomen).  · You have a fever.  · You pass large clumps of blood (clots) from your vagina that are walnut-sized or larger. Save the clumps for your doctor to see.  · You pass large amounts of tissue from your vagina. Save the tissue for your doctor to see.  · You have more bleeding.  · You have thick, bad-smelling fluid (discharge) coming from the vagina.  · You get lightheaded, weak, or you pass out (faint).  · You have chills.  MAKE SURE YOU:  · Understand these instructions.  · Will watch your condition.  · Will get help right away if you are not doing well or get worse.  Document Released: 07/05/2011 Document Reviewed: 07/05/2011  ExitCare® Patient Information ©2014 [employer].

## 2013-05-17 NOTE — MAU Provider Note (Signed)
Attestation of Attending Supervision of Advanced Practitioner (CNM/NP): Evaluation and management procedures were performed by the Advanced Practitioner under my supervision and collaboration.  I have reviewed the Advanced Practitioner's note and chart, and I agree with the management and plan.  Staton Markey 05/17/2013 8:29 AM   

## 2013-06-05 ENCOUNTER — Inpatient Hospital Stay (HOSPITAL_COMMUNITY)
Admission: AD | Admit: 2013-06-05 | Discharge: 2013-06-05 | Disposition: A | Discharge status: Home / Self Care | Payer: Medicaid Other | Source: Ambulatory Visit | Attending: Obstetrics | Admitting: Obstetrics

## 2013-06-05 ENCOUNTER — Encounter (HOSPITAL_COMMUNITY): Payer: Self-pay | Admitting: *Deleted

## 2013-06-05 DIAGNOSIS — R109 Unspecified abdominal pain: Secondary | ICD-10-CM | POA: Insufficient documentation

## 2013-06-05 DIAGNOSIS — A088 Other specified intestinal infections: Secondary | ICD-10-CM

## 2013-06-05 DIAGNOSIS — K5289 Other specified noninfective gastroenteritis and colitis: Secondary | ICD-10-CM | POA: Insufficient documentation

## 2013-06-05 DIAGNOSIS — R42 Dizziness and giddiness: Secondary | ICD-10-CM | POA: Insufficient documentation

## 2013-06-05 DIAGNOSIS — A084 Viral intestinal infection, unspecified: Secondary | ICD-10-CM

## 2013-06-05 HISTORY — DX: Anemia, unspecified: D64.9

## 2013-06-05 LAB — URINALYSIS, ROUTINE W REFLEX MICROSCOPIC
Bilirubin Urine: NEGATIVE
GLUCOSE, UA: NEGATIVE mg/dL
HGB URINE DIPSTICK: NEGATIVE
KETONES UR: NEGATIVE mg/dL
Leukocytes, UA: NEGATIVE
Nitrite: NEGATIVE
Protein, ur: NEGATIVE mg/dL
Specific Gravity, Urine: 1.025 (ref 1.005–1.030)
Urobilinogen, UA: 1 mg/dL (ref 0.0–1.0)
pH: 6 (ref 5.0–8.0)

## 2013-06-05 LAB — HCG, QUANTITATIVE, PREGNANCY: hCG, Beta Chain, Quant, S: 1 m[IU]/mL (ref <5)

## 2013-06-05 LAB — CBC
HEMATOCRIT: 35.6 % — AB (ref 36.0–46.0)
HEMOGLOBIN: 11.3 g/dL — AB (ref 12.0–15.0)
MCH: 21.3 pg — AB (ref 26.0–34.0)
MCHC: 31.7 g/dL (ref 30.0–36.0)
MCV: 67.2 fL — AB (ref 78.0–100.0)
Platelets: 377 10*3/uL (ref 150–400)
RBC: 5.3 MIL/uL — ABNORMAL HIGH (ref 3.87–5.11)
RDW: 17.8 % — ABNORMAL HIGH (ref 11.5–15.5)
WBC: 15.8 10*3/uL — ABNORMAL HIGH (ref 4.0–10.5)

## 2013-06-05 LAB — POCT PREGNANCY, URINE: PREG TEST UR: NEGATIVE

## 2013-06-05 MED ORDER — PROMETHAZINE HCL 12.5 MG PO TABS: 12.5000 mg | ORAL_TABLET | Freq: Four times a day (QID) | ORAL | Status: DC | PRN

## 2013-06-05 MED ORDER — PROMETHAZINE HCL 25 MG PO TABS
25.0000 mg | ORAL_TABLET | Freq: Once | ORAL | Status: AC
  Administered 2013-06-05: 25 mg via ORAL
  Filled 2013-06-05: qty 1

## 2013-06-05 NOTE — Discharge Instructions (Signed)
Diet for Diarrhea, Adult  Frequent, runny stools (diarrhea) may be caused or worsened by food or drink. Diarrhea may be relieved by changing your diet. Since diarrhea can last up to 7 days, it is easy for you to lose too much fluid from the body and become dehydrated. Fluids that are lost need to be replaced. Along with a modified diet, make sure you drink enough fluids to keep your urine clear or pale yellow.  DIET INSTRUCTIONS  · Ensure adequate fluid intake (hydration): have 1 cup (8 oz) of fluid for each diarrhea episode. Avoid fluids that contain simple sugars or sports drinks, fruit juices, whole milk products, and sodas. Your urine should be clear or pale yellow if you are drinking enough fluids. Hydrate with an oral rehydration solution that you can purchase at pharmacies, retail stores, and online. You can prepare an oral rehydration solution at home by mixing the following ingredients together:  ·   tsp table salt.  · ¾ tsp baking soda.  ·  tsp salt substitute containing potassium chloride.  · 1  tablespoons sugar.  · 1 L (34 oz) of water.  · Certain foods and beverages may increase the speed at which food moves through the gastrointestinal (GI) tract. These foods and beverages should be avoided and include:  · Caffeinated and alcoholic beverages.  · High-fiber foods, such as raw fruits and vegetables, nuts, seeds, and whole grain breads and cereals.  · Foods and beverages sweetened with sugar alcohols, such as xylitol, sorbitol, and mannitol.  · Some foods may be well tolerated and may help thicken stool including:  · Starchy foods, such as rice, toast, pasta, low-sugar cereal, oatmeal, grits, baked potatoes, crackers, and bagels.    · Bananas.    · Applesauce.  · Add probiotic-rich foods to help increase healthy bacteria in the GI tract, such as yogurt and fermented milk products.  RECOMMENDED FOODS AND BEVERAGES  Starches  Choose foods with less than 2 g of fiber per serving.  · Recommended:  White,  French, and pita breads, plain rolls, buns, bagels. Plain muffins, matzo. Soda, saltine, or graham crackers. Pretzels, melba toast, zwieback. Cooked cereals made with water: cornmeal, farina, cream cereals. Dry cereals: refined corn, wheat, rice. Potatoes prepared any way without skins, refined macaroni, spaghetti, noodles, refined rice.  · Avoid:  Bread, rolls, or crackers made with whole wheat, multi-grains, rye, bran seeds, nuts, or coconut. Corn tortillas or taco shells. Cereals containing whole grains, multi-grains, bran, coconut, nuts, raisins. Cooked or dry oatmeal. Coarse wheat cereals, granola. Cereals advertised as "high-fiber." Potato skins. Whole grain pasta, wild or brown rice. Popcorn. Sweet potatoes, yams. Sweet rolls, doughnuts, waffles, pancakes, sweet breads.  Vegetables  · Recommended: Strained tomato and vegetable juices. Most well-cooked and canned vegetables without seeds. Fresh: Tender lettuce, cucumber without the skin, cabbage, spinach, bean sprouts.  · Avoid: Fresh, cooked, or canned: Artichokes, baked beans, beet greens, broccoli, Brussels sprouts, corn, kale, legumes, peas, sweet potatoes. Cooked: Green or red cabbage, spinach. Avoid large servings of any vegetables because vegetables shrink when cooked, and they contain more fiber per serving than fresh vegetables.  Fruit  · Recommended: Cooked or canned: Apricots, applesauce, cantaloupe, cherries, fruit cocktail, grapefruit, grapes, kiwi, mandarin oranges, peaches, pears, plums, watermelon. Fresh: Apples without skin, ripe banana, grapes, cantaloupe, cherries, grapefruit, peaches, oranges, plums. Keep servings limited to ½ cup or 1 piece.  · Avoid: Fresh: Apples with skin, apricots, mangoes, pears, raspberries, strawberries. Prune juice, stewed or dried prunes. Dried   fruits, raisins, dates. Large servings of all fresh fruits.  Protein  · Recommended: Ground or well-cooked tender beef, ham, veal, lamb, pork, or poultry. Eggs. Fish,  oysters, shrimp, lobster, other seafoods. Liver, organ meats.  · Avoid: Tough, fibrous meats with gristle. Peanut butter, smooth or chunky. Cheese, nuts, seeds, legumes, dried peas, beans, lentils.  Dairy  · Recommended: Yogurt, lactose-free milk, kefir, drinkable yogurt, buttermilk, soy milk, or plain hard cheese.  · Avoid: Milk, chocolate milk, beverages made with milk, such as milkshakes.  Soups  · Recommended: Bouillon, broth, or soups made from allowed foods. Any strained soup.  · Avoid: Soups made from vegetables that are not allowed, cream or milk-based soups.  Desserts and Sweets  · Recommended: Sugar-free gelatin, sugar-free frozen ice pops made without sugar alcohol.  · Avoid: Plain cakes and cookies, pie made with fruit, pudding, custard, cream pie. Gelatin, fruit, ice, sherbet, frozen ice pops. Ice cream, ice milk without nuts. Plain hard candy, honey, jelly, molasses, syrup, sugar, chocolate syrup, gumdrops, marshmallows.  Fats and Oils  · Recommended: Limit fats to less than 8 tsp per day.  · Avoid: Seeds, nuts, olives, avocados. Margarine, butter, cream, mayonnaise, salad oils, plain salad dressings. Plain gravy, crisp bacon without rind.  Beverages  · Recommended: Water, decaffeinated teas, oral rehydration solutions, sugar-free beverages not sweetened with sugar alcohols.  · Avoid: Fruit juices, caffeinated beverages (coffee, tea, soda), alcohol, sports drinks, or lemon-lime soda.  Condiments  · Recommended: Ketchup, mustard, horseradish, vinegar, cocoa powder. Spices in moderation: allspice, basil, bay leaves, celery powder or leaves, cinnamon, cumin powder, curry powder, ginger, mace, marjoram, onion or garlic powder, oregano, paprika, parsley flakes, ground pepper, rosemary, sage, savory, tarragon, thyme, turmeric.  · Avoid: Coconut, honey.  Document Released: 07/03/2003 Document Revised: 01/05/2012 Document Reviewed: 08/27/2011  ExitCare® Patient Information ©2014 [employer].

## 2013-06-05 NOTE — MAU Note (Signed)
Pt not in lobby.  

## 2013-06-05 NOTE — MAU Note (Signed)
Pt states upper abd pain x all day today, nauseated but no vomiting. Feels lightheaded. Sitting in a wheelchair.

## 2013-06-05 NOTE — MAU Provider Note (Signed)
History     CSN: [id number]  Arrival date and time: 06/05/13 4098   First Provider Initiated Contact with Patient 06/05/13 2145      Chief Complaint  Patient presents with  . Abdominal Pain   Abdominal Pain    Jaclyn Tyler is 23 y.o. [phone] who presents today with abdominal pain, lightheadedness and nausea. She denies any vomiting/diarrhea, fever or close contacts who have been sick. She denies any vaginal bleeding or vaginal discharge. She had a miscarriage in January, and has not had her period return since then. She has had unprotected intercourse since then. She has not called her doctors office about her symptoms today.   Past Medical History  Diagnosis Date  . Gonorrhea   . Constipation   . Anemia     Past Surgical History  Procedure Laterality Date  . Wisdom tooth extraction      History reviewed. No pertinent family history.  History  Substance Use Topics  . Smoking status: Never Smoker   . Smokeless tobacco: Not on file  . Alcohol Use: Yes     Comment: occasion    Allergies:  Allergies  Allergen Reactions  . Other Hives    Paprika seasoning    No prescriptions prior to admission    Review of Systems  Gastrointestinal: Positive for abdominal pain.   Physical Exam   Blood pressure 111/65, pulse 109, temperature 99 F (37.2 C), temperature source Oral, resp. rate 18, height 6' (1.829 m), weight 99.791 kg (220 lb), last menstrual period 03/23/2013, SpO2 100.00%, unknown if currently breastfeeding.  Physical Exam  Nursing note and vitals reviewed. Constitutional: She is oriented to person, place, and time. She appears well-developed and well-nourished. No distress.  Cardiovascular: Normal rate.   Respiratory: Effort normal.  GI: Soft. There is no tenderness.  Neurological: She is alert and oriented to person, place, and time.  Skin: Skin is warm and dry.  Psychiatric: She has a normal mood and affect.    MAU Course   Procedures  Results for orders placed during the hospital encounter of 06/05/13 (from the past 24 hour(s))  HCG, QUANTITATIVE, PREGNANCY     Status: None   Collection Time    06/05/13  9:20 PM      Result Value Range   hCG, Beta Chain, Quant, S 1  <5 mIU/mL  CBC     Status: Abnormal   Collection Time    06/05/13  9:20 PM      Result Value Range   WBC 15.8 (*) 4.0 - 10.5 K/uL   RBC 5.30 (*) 3.87 - 5.11 MIL/uL   Hemoglobin 11.3 (*) 12.0 - 15.0 g/dL   HCT 29.5 (*) 62.1 - 30.8 %   MCV 67.2 (*) 78.0 - 100.0 fL   MCH 21.3 (*) 26.0 - 34.0 pg   MCHC 31.7  30.0 - 36.0 g/dL   RDW 65.7 (*) 84.6 - 96.2 %   Platelets 377  150 - 400 K/uL  URINALYSIS, ROUTINE W REFLEX MICROSCOPIC     Status: None   Collection Time    06/05/13  9:25 PM      Result Value Range   Color, Urine YELLOW  YELLOW   APPearance CLEAR  CLEAR   Specific Gravity, Urine 1.025  1.005 - 1.030   pH 6.0  5.0 - 8.0   Glucose, UA NEGATIVE  NEGATIVE mg/dL   Hgb urine dipstick NEGATIVE  NEGATIVE   Bilirubin Urine NEGATIVE  NEGATIVE   Ketones, ur  NEGATIVE  NEGATIVE mg/dL   Protein, ur NEGATIVE  NEGATIVE mg/dL   Urobilinogen, UA 1.0  0.0 - 1.0 mg/dL   Nitrite NEGATIVE  NEGATIVE   Leukocytes, UA NEGATIVE  NEGATIVE  POCT PREGNANCY, URINE     Status: None   Collection Time    06/05/13  9:36 PM      Result Value Range   Preg Test, Ur NEGATIVE  NEGATIVE     Assessment and Plan   1. Viral gastroenteritis      Medication List         promethazine 12.5 MG tablet  Commonly known as:  PHENERGAN  Take 1 tablet (12.5 mg total) by mouth every 6 (six) hours as needed for nausea or vomiting.       Follow-up Information   Follow up with Kathreen CosierMARSHALL,BERNARD A, MD. (As needed)    Specialty:  Obstetrics and Gynecology   Contact information:   [address] [place] [address] [phone]        Tawnya CrookHogan, Seif Teichert Donovan 06/05/2013, 10:09 PM

## 2013-06-21 ENCOUNTER — Emergency Department (HOSPITAL_COMMUNITY)
Admission: EM | Admit: 2013-06-21 | Discharge: 2013-06-21 | Disposition: A | Discharge status: Home / Self Care | Payer: Medicaid Other | Attending: Emergency Medicine | Admitting: Emergency Medicine

## 2013-06-21 ENCOUNTER — Encounter (HOSPITAL_COMMUNITY): Payer: Self-pay | Admitting: Emergency Medicine

## 2013-06-21 DIAGNOSIS — J020 Streptococcal pharyngitis: Secondary | ICD-10-CM | POA: Insufficient documentation

## 2013-06-21 DIAGNOSIS — Z8619 Personal history of other infectious and parasitic diseases: Secondary | ICD-10-CM | POA: Insufficient documentation

## 2013-06-21 DIAGNOSIS — R000 Tachycardia, unspecified: Secondary | ICD-10-CM | POA: Insufficient documentation

## 2013-06-21 DIAGNOSIS — Z8719 Personal history of other diseases of the digestive system: Secondary | ICD-10-CM | POA: Insufficient documentation

## 2013-06-21 DIAGNOSIS — Z862 Personal history of diseases of the blood and blood-forming organs and certain disorders involving the immune mechanism: Secondary | ICD-10-CM | POA: Insufficient documentation

## 2013-06-21 LAB — RAPID STREP SCREEN (MED CTR MEBANE ONLY): Streptococcus, Group A Screen (Direct): POSITIVE — AB

## 2013-06-21 MED ORDER — LIDOCAINE VISCOUS 2 % MT SOLN: 20.0000 mL | OROMUCOSAL | Status: DC | PRN

## 2013-06-21 MED ORDER — IBUPROFEN 800 MG PO TABS: 800.0000 mg | ORAL_TABLET | Freq: Three times a day (TID) | ORAL | Status: DC

## 2013-06-21 MED ORDER — LIDOCAINE VISCOUS 2 % MT SOLN
15.0000 mL | Freq: Once | OROMUCOSAL | Status: AC
  Administered 2013-06-21: 15 mL via OROMUCOSAL
  Filled 2013-06-21: qty 15

## 2013-06-21 MED ORDER — SODIUM CHLORIDE 0.9 % IV BOLUS (SEPSIS)
1000.0000 mL | Freq: Once | INTRAVENOUS | Status: AC
  Administered 2013-06-21: 1000 mL via INTRAVENOUS

## 2013-06-21 MED ORDER — PENICILLIN V POTASSIUM 500 MG PO TABS: 500.0000 mg | ORAL_TABLET | Freq: Four times a day (QID) | ORAL | Status: DC

## 2013-06-21 NOTE — ED Notes (Signed)
C/o of sore throat..right side. Pt. Has an elevated hr (120's) in triage. Not taking any stimulants and no fever. Pt. Under much stress at school, work, Catering manageretc.

## 2013-06-21 NOTE — Discharge Instructions (Signed)
Please follow up with your primary care physician in 1-2 days. If you do not have one please call the [facility] number listed above. Please take your antibiotic until completion. Please take Motrin and Xylocaine as prescribed. Please read all discharge instructions and return precautions.   Strep Throat Strep throat is an infection of the throat caused by a bacteria named Streptococcus pyogenes. Your caregiver may call the infection streptococcal "tonsillitis" or "pharyngitis" depending on whether there are signs of inflammation in the tonsils or back of the throat. Strep throat is most common in children aged 5 15 years during the cold months of the year, but it can occur in people of any age during any season. This infection is spread from person to person (contagious) through coughing, sneezing, or other close contact. SYMPTOMS   Fever or chills.  Painful, swollen, red tonsils or throat.  Pain or difficulty when swallowing.  White or yellow spots on the tonsils or throat.  Swollen, tender lymph nodes or "glands" of the neck or under the jaw.  Red rash all over the body (rare). DIAGNOSIS  Many different infections can cause the same symptoms. A test must be done to confirm the diagnosis so the right treatment can be given. A "rapid strep test" can help your caregiver make the diagnosis in a few minutes. If this test is not available, a light swab of the infected area can be used for a throat culture test. If a throat culture test is done, results are usually available in a day or two. TREATMENT  Strep throat is treated with antibiotic medicine. HOME CARE INSTRUCTIONS   Gargle with 1 tsp of salt in 1 cup of warm water, 3 4 times per day or as needed for comfort.  Family members who also have a sore throat or fever should be tested for strep throat and treated with antibiotics if they have the strep infection.  Make sure everyone in your household washes their hands  well.  Do not share food, drinking cups, or personal items that could cause the infection to spread to others.  You may need to eat a soft food diet until your sore throat gets better.  Drink enough water and fluids to keep your urine clear or pale yellow. This will help prevent dehydration.  Get plenty of rest.  Stay home from school, daycare, or work until you have been on antibiotics for 24 hours.  Only take over-the-counter or prescription medicines for pain, discomfort, or fever as directed by your caregiver.  If antibiotics are prescribed, take them as directed. Finish them even if you start to feel better. SEEK MEDICAL CARE IF:   The glands in your neck continue to enlarge.  You develop a rash, cough, or earache.  You cough up green, yellow-brown, or bloody sputum.  You have pain or discomfort not controlled by medicines.  Your problems seem to be getting worse rather than better. SEEK IMMEDIATE MEDICAL CARE IF:   You develop any new symptoms such as vomiting, severe headache, stiff or painful neck, chest pain, shortness of breath, or trouble swallowing.  You develop severe throat pain, drooling, or changes in your voice.  You develop swelling of the neck, or the skin on the neck becomes red and tender.  You have a fever.  You develop signs of dehydration, such as fatigue, dry mouth, and decreased urination.  You become increasingly sleepy, or you cannot wake up completely. Document Released: 04/09/2000 Document Revised:  03/29/2012 Document Reviewed: 06/11/2010 ExitCare Patient Information 2014 [place], [place].

## 2013-06-21 NOTE — ED Provider Notes (Signed)
CSN: [id number]     Arrival date & time 06/21/13  2029 History   First MD Initiated Contact with Patient 06/21/13 2041     Chief Complaint  Patient presents with  . Sore Throat  . Tachycardia     (Consider location/radiation/quality/duration/timing/severity/associated sxs/prior Treatment) HPI Comments: Patient is a 23 year old female past medical history significant for anemia, constipation presenting to the emergency department for right-sided sore throat that began last evening. She states no alleviating factors. She states her pain is worsened with eating. Patient denies any palpitations, CP, SOB, trouble swallowing, fevers, chills, nausea, vomiting, abdominal pain, diarrhea, constipation. Denies any extra caffeine, stimulant use. Denies any recent travel, unilateral leg swelling, surgeries in the last month, recent immobilizations.    Past Medical History  Diagnosis Date  . Gonorrhea   . Constipation   . Anemia    Past Surgical History  Procedure Laterality Date  . Wisdom tooth extraction     No family history on file. History  Substance Use Topics  . Smoking status: Never Smoker   . Smokeless tobacco: Not on file  . Alcohol Use: Yes     Comment: occasion   OB History   Grav Para Term Preterm Abortions TAB SAB Ect Mult Living   4 2 2  1  1   2      Review of Systems  Constitutional: Negative for fever and chills.  HENT: Positive for sore throat.   Respiratory: Negative for cough and shortness of breath.   Cardiovascular: Negative for chest pain, palpitations and leg swelling.  Gastrointestinal: Negative for nausea, vomiting, abdominal pain and diarrhea.  Neurological: Negative for dizziness and light-headedness.  All other systems reviewed and are negative.      Allergies  Other  Home Medications   Current Outpatient Rx  Name  Route  Sig  Dispense  Refill  . Pseudoeph-Doxylamine-DM-APAP (NYQUIL PO)   Oral   Take 5 mLs by mouth at bedtime as needed (for  cough/cold symptoms).         Marland Kitchen. ibuprofen (ADVIL,MOTRIN) 800 MG tablet   Oral   Take 1 tablet (800 mg total) by mouth 3 (three) times daily.   21 tablet   0   . lidocaine (XYLOCAINE) 2 % solution   Mouth/Throat   Use as directed 20 mLs in the mouth or throat as needed for mouth pain.   100 mL   0   . penicillin v potassium (VEETID) 500 MG tablet   Oral   Take 1 tablet (500 mg total) by mouth 4 (four) times daily.   40 tablet   0    BP 107/63  Pulse 96  Temp(Src) 98.8 F (37.1 C) (Oral)  Resp 24  Ht 6' (1.829 m)  Wt 230 lb (104.327 kg)  BMI 31.19 kg/m2  SpO2 100%  LMP 05/11/2013 Physical Exam  Constitutional: She is oriented to person, place, and time. She appears well-developed and well-nourished. No distress.  HENT:  Head: Normocephalic and atraumatic.  Right Ear: External ear normal.  Left Ear: External ear normal.  Nose: Nose normal.  Mouth/Throat: Oropharyngeal exudate present.  Eyes: Conjunctivae are normal.  Neck: Normal range of motion. Neck supple.  Cardiovascular: Regular rhythm, normal heart sounds and intact distal pulses.  Tachycardia present.   Pulmonary/Chest: Effort normal and breath sounds normal. No stridor. No respiratory distress. She has no wheezes. She has no rales. She exhibits no tenderness.  Abdominal: Soft. Bowel sounds are normal. There is no tenderness.  Musculoskeletal: Normal range of motion. She exhibits no edema.  Lymphadenopathy:    She has cervical adenopathy.  Neurological: She is alert and oriented to person, place, and time.  Skin: Skin is warm and dry. She is not diaphoretic.  Psychiatric: She has a normal mood and affect.    ED Course  Procedures (including critical care time) Medications  sodium chloride 0.9 % bolus 1,000 mL (0 mLs Intravenous Stopped 06/21/13 2303)  lidocaine (XYLOCAINE) 2 % viscous mouth solution 15 mL (15 mLs Mouth/Throat Given 06/21/13 2140)    Labs Review Labs Reviewed  RAPID STREP SCREEN -  Abnormal; Notable for the following:    Streptococcus, Group A Screen (Direct) POSITIVE (*)    All other components within normal limits   Imaging Review No results found.  EKG Interpretation   None       MDM   Final diagnoses:  Strep pharyngitis   Filed Vitals:   06/21/13 2245  BP: 107/63  Pulse: 96  Temp:   Resp: 24    Afebrile, NAD, non-toxic appearing, AAOx4.   Pt febrile with tonsillar exudate, cervical lymphadenopathy, & dysphagia; diagnosis of strep. Treated in the Ed with fluids, xylocaine. Patient noted to be tachycardic, she does not have any associated symptoms such as chest pain, chest tightness, palpitations, shortness of breath, dyspnea on exertion, unilateral leg swelling. IV fluid boluses given with successful reduction of heart rate. Although unable to [facility] patient, low suspicion for PE, likely d/t small component of dehydration and infection and stress.  Pt appears mildly dehydrated, discussed importance of water rehydration. Presentation non concerning for PTA or infxn spread to soft tissue. No trismus or uvula deviation. Specific return precautions discussed. Pt able to drink water in ED without difficulty with intact air way. Recommended PCP follow up.      Jeannetta Ellis, PA-C 06/22/13 [phone]

## 2013-06-22 NOTE — ED Provider Notes (Signed)
Medical screening examination/treatment/procedure(s) were performed by non-physician practitioner and as supervising physician I was immediately available for consultation/collaboration.   Shanna CiscoMegan E Docherty, MD 06/22/13 1322

## 2013-10-12 ENCOUNTER — Encounter (HOSPITAL_COMMUNITY): Payer: Self-pay | Admitting: Emergency Medicine

## 2013-10-12 ENCOUNTER — Emergency Department (HOSPITAL_COMMUNITY)
Admission: EM | Admit: 2013-10-12 | Discharge: 2013-10-12 | Disposition: A | Discharge status: Home / Self Care | Payer: Medicaid Other | Attending: Emergency Medicine | Admitting: Emergency Medicine

## 2013-10-12 DIAGNOSIS — Z862 Personal history of diseases of the blood and blood-forming organs and certain disorders involving the immune mechanism: Secondary | ICD-10-CM | POA: Insufficient documentation

## 2013-10-12 DIAGNOSIS — N760 Acute vaginitis: Secondary | ICD-10-CM | POA: Insufficient documentation

## 2013-10-12 DIAGNOSIS — A499 Bacterial infection, unspecified: Secondary | ICD-10-CM | POA: Insufficient documentation

## 2013-10-12 DIAGNOSIS — B9689 Other specified bacterial agents as the cause of diseases classified elsewhere: Secondary | ICD-10-CM | POA: Insufficient documentation

## 2013-10-12 DIAGNOSIS — A5901 Trichomonal vulvovaginitis: Secondary | ICD-10-CM | POA: Insufficient documentation

## 2013-10-12 DIAGNOSIS — Z8719 Personal history of other diseases of the digestive system: Secondary | ICD-10-CM | POA: Insufficient documentation

## 2013-10-12 LAB — WET PREP, GENITAL: Yeast Wet Prep HPF POC: NONE SEEN

## 2013-10-12 MED ORDER — AZITHROMYCIN 250 MG PO TABS
1000.0000 mg | ORAL_TABLET | Freq: Once | ORAL | Status: AC
  Administered 2013-10-12: 1000 mg via ORAL
  Filled 2013-10-12: qty 4

## 2013-10-12 MED ORDER — LIDOCAINE HCL (PF) 1 % IJ SOLN
INTRAMUSCULAR | Status: AC
  Administered 2013-10-12: 2 mL
  Filled 2013-10-12: qty 5

## 2013-10-12 MED ORDER — METRONIDAZOLE 500 MG PO TABS: 500.0000 mg | ORAL_TABLET | Freq: Two times a day (BID) | ORAL | Status: DC

## 2013-10-12 MED ORDER — CEFTRIAXONE SODIUM 250 MG IJ SOLR
250.0000 mg | Freq: Once | INTRAMUSCULAR | Status: AC
  Administered 2013-10-12: 250 mg via INTRAMUSCULAR
  Filled 2013-10-12: qty 250

## 2013-10-12 NOTE — ED Notes (Signed)
Pt upset about lab results. Emotional support given. Reviewed discharge instructions and prescriptions with pt. Questions answered. Pt understood. VS are wnl. NAD noted.

## 2013-10-12 NOTE — ED Notes (Signed)
PA at bedside.

## 2013-10-12 NOTE — ED Notes (Signed)
Notified lab sending order requisitions

## 2013-10-12 NOTE — ED Provider Notes (Signed)
CSN: [id number]     Arrival date & time 10/12/13  1632 History  This chart was scribed for Jaclyn BeckKaitlyn Szekalski, PA-C working with [name], DO by Evon Slackerrance Branch, ED Scribe. This patient was seen in room TR11C/TR11C and the patient's care was started at 4:47 PM.    Chief Complaint  Patient presents with  . Pelvic Pain   The history is provided by the patient. No language interpreter was used.   HPI Comments: Jaclyn Tyler is a 23 y.o. female who presents to the Emergency Department complaining of pelvic pain onset 2 days prior. She states she has associated vaginal discharge, and dysuria.  She states that she recently has a new sex partner. She states that he most likely has other sexual partners. She denies abdominal pain or any other related symptoms. She states that she would like to also get tested for STD's.    Past Medical History  Diagnosis Date  . Gonorrhea   . Constipation   . Anemia    Past Surgical History  Procedure Laterality Date  . Wisdom tooth extraction     History reviewed. No pertinent family history. History  Substance Use Topics  . Smoking status: Never Smoker   . Smokeless tobacco: Not on file  . Alcohol Use: Yes     Comment: occasion   OB History   Grav Para Term Preterm Abortions TAB SAB Ect Mult Living   4 2 2  1  1   2      Review of Systems  Gastrointestinal: Negative for abdominal pain.  Genitourinary: Positive for dysuria, vaginal discharge and pelvic pain.  All other systems reviewed and are negative.   Allergies  Other  Home Medications   Prior to Admission medications   Not on File   Triage Vitals: BP 131/65  Pulse 94  Temp(Src) 98.1 F (36.7 C) (Oral)  Resp 18  Wt 220 lb 9 oz (100.046 kg)  SpO2 97%  Physical Exam  Nursing note and vitals reviewed. Constitutional: She is oriented to person, place, and time. She appears well-developed and well-nourished. No distress.  HENT:  Head: Normocephalic and atraumatic.   Eyes: Conjunctivae and EOM are normal.  Neck: Neck supple.  Cardiovascular: Normal rate.   Pulmonary/Chest: Effort normal. No respiratory distress.  Genitourinary: Vagina normal.  Copious, white, frothy discharge noted in vagina. No CMT. Mild generalized tenderness to palpation on bimanual exam.   Musculoskeletal: Normal range of motion.  Neurological: She is alert and oriented to person, place, and time.  Skin: Skin is warm and dry.  Psychiatric: She has a normal mood and affect. Her behavior is normal.    ED Course  Procedures (including critical care time) DIAGNOSTIC STUDIES: Oxygen Saturation is 97% on RA, normal by my interpretation.    COORDINATION OF CARE: 4:48 PM-Discussed treatment plan which includes pelvic exam and UA with pt at bedside and pt agreed to plan.    Labs Review Labs Reviewed  WET PREP, GENITAL - Abnormal; Notable for the following:    Trich, Wet Prep MANY (*)    Clue Cells Wet Prep HPF POC MODERATE (*)    WBC, Wet Prep HPF POC TOO NUMEROUS TO COUNT (*)    All other components within normal limits  GC/CHLAMYDIA PROBE AMP    Imaging Review No results found.   EKG Interpretation None      MDM   Final diagnoses:  Trichomonas vaginitis  BV (bacterial vaginosis)   6:20 PM Patient's wet prep  shows trichomonas and clue cells. Patient will be treated for GC/chlamydia here. Patient defers urinalysis and urine pregnancy at this time. Vitals stable and patient afebrile. No further evaluation needed at this time.   I personally performed the services described in this documentation, which was scribed in my presence. The recorded information has been reviewed and is accurate.      Jaclyn BeckKaitlyn Szekalski, PA-C 10/12/13 1827

## 2013-10-12 NOTE — Discharge Instructions (Signed)
Take Flagyl as directed until gone for your infection. You will be contacted within 48 hours if your gonorrhea and chlamydia results are positive. You have been treated for gonorrhea and chlamydia here in case your results are positive. Refer to attached documents for more information.

## 2013-10-12 NOTE — ED Notes (Signed)
shes had pelvic pain and yellow vaginal discharge x 2 days. She has a new sexual partner so she wants to be checked for STD and pregnancy.

## 2013-10-12 NOTE — ED Notes (Signed)
Water given to pt to help with urination for urine sample.

## 2013-10-13 LAB — GC/CHLAMYDIA PROBE AMP
CT Probe RNA: NEGATIVE
GC PROBE AMP APTIMA: NEGATIVE

## 2013-10-14 NOTE — ED Provider Notes (Signed)
Medical screening examination/treatment/procedure(s) were performed by non-physician practitioner and as supervising physician I was immediately available for consultation/collaboration.   EKG Interpretation None        Tinia Oravec M Alexzavier Girardin, DO 10/14/13 1510 

## 2014-02-25 ENCOUNTER — Encounter (HOSPITAL_COMMUNITY): Payer: Self-pay | Admitting: Emergency Medicine

## 2014-04-23 ENCOUNTER — Inpatient Hospital Stay (HOSPITAL_COMMUNITY)
Admission: AD | Admit: 2014-04-23 | Discharge: 2014-04-24 | Disposition: A | Discharge status: Home / Self Care | Payer: Medicaid Other | Source: Ambulatory Visit | Attending: Obstetrics & Gynecology | Admitting: Obstetrics & Gynecology

## 2014-04-23 ENCOUNTER — Encounter (HOSPITAL_COMMUNITY): Payer: Self-pay | Admitting: *Deleted

## 2014-04-23 DIAGNOSIS — J069 Acute upper respiratory infection, unspecified: Secondary | ICD-10-CM

## 2014-04-23 DIAGNOSIS — Z3201 Encounter for pregnancy test, result positive: Secondary | ICD-10-CM | POA: Insufficient documentation

## 2014-04-23 LAB — URINALYSIS, ROUTINE W REFLEX MICROSCOPIC
Bilirubin Urine: NEGATIVE
Glucose, UA: NEGATIVE mg/dL
Hgb urine dipstick: NEGATIVE
KETONES UR: NEGATIVE mg/dL
NITRITE: NEGATIVE
PH: 6 (ref 5.0–8.0)
Protein, ur: NEGATIVE mg/dL
Specific Gravity, Urine: 1.025 (ref 1.005–1.030)
Urobilinogen, UA: 0.2 mg/dL (ref 0.0–1.0)

## 2014-04-23 LAB — URINE MICROSCOPIC-ADD ON

## 2014-04-23 LAB — POCT PREGNANCY, URINE: Preg Test, Ur: POSITIVE — AB

## 2014-04-23 NOTE — Discharge Instructions (Signed)
Upper Respiratory Infection, Adult An upper respiratory infection (URI) is also known as the common cold. It is often caused by a type of germ (virus). Colds are easily spread (contagious). You can pass it to others by kissing, coughing, sneezing, or drinking out of the same glass. Usually, you get better in 1 or 2 weeks.  HOME CARE   Only take medicine as told by your doctor.  Use a warm mist humidifier or breathe in steam from a hot shower.  Drink enough water and fluids to keep your pee (urine) clear or pale yellow.  Get plenty of rest.  Return to work when your temperature is back to normal or as told by your doctor. You may use a face mask and wash your hands to stop your cold from spreading. GET HELP RIGHT AWAY IF:   After the first few days, you feel you are getting worse.  You have questions about your medicine.  You have chills, shortness of breath, or brown or red spit (mucus).  You have yellow or brown snot (nasal discharge) or pain in the face, especially when you bend forward.  You have a fever, puffy (swollen) neck, pain when you swallow, or white spots in the back of your throat.  You have a bad headache, ear pain, sinus pain, or chest pain.  You have a high-pitched whistling sound when you breathe in and out (wheezing).  You have a lasting cough or cough up blood.  You have sore muscles or a stiff neck. MAKE SURE YOU:   Understand these instructions.  Will watch your condition.  Will get help right away if you are not doing well or get worse. Document Released: 09/29/2007 Document Revised: 07/05/2011 Document Reviewed: 07/18/2013 Exit[employer] Patient Information 2015 [employer]. This information is not intended to replace advice given to you by your health care provider. Make sure you discuss any questions you have with your health care provider.  

## 2014-04-23 NOTE — MAU Note (Signed)
PT SAYS SHE STARTED HAVING  COUGH AND  CONGESTION ON Friday.  -  TOOK THERAFLU AND NYQUIL-  WITH NO RELIEF.     NO BIRTH CONTROL-  LAST SEX-  YESTERDAY.    NO HPT.   NO SORE THROAT, VOMITING, DIARRHEA,

## 2014-04-23 NOTE — MAU Provider Note (Signed)
History     CSN: [id number]  Arrival date and time: 04/23/14 2225   First Provider Initiated Contact with Patient 04/23/14 2329      No chief complaint on file.  HPI  [name] is a 23 y.o. [id number] at [id number] who presents to MAU today with complaint of URI symptoms since Friday. She states nasal congestion, productive cough and chest pain with coughing. She denies sore throat, fever, body aches, ear pain or drainage or sick contacts. She states LMP of 03/22/14 and requests UPT.    OB History    Gravida Para Term Preterm AB TAB SAB Ectopic Multiple Living   4 2 1  2  2   2       Past Medical History  Diagnosis Date  . Gonorrhea   . Constipation   . Anemia     Past Surgical History  Procedure Laterality Date  . Wisdom tooth extraction      History reviewed. No pertinent family history.  History  Substance Use Topics  . Smoking status: Never Smoker   . Smokeless tobacco: Not on file  . Alcohol Use: Yes     Comment: occasion  LAST TIME-  04-02-2014    Allergies:  Allergies  Allergen Reactions  . Other Hives    Paprika seasoning    Prescriptions prior to admission  Medication Sig Dispense Refill Last Dose  . metroNIDAZOLE (FLAGYL) 500 MG tablet Take 1 tablet (500 mg total) by mouth 2 (two) times daily. 14 tablet 0 More than a month at Unknown time    Review of Systems  Constitutional: Negative for fever, chills and malaise/fatigue.  HENT: Positive for congestion. Negative for ear discharge, ear pain and sore throat.   Respiratory: Positive for cough and sputum production.   Cardiovascular: Negative for chest pain.  Gastrointestinal: Negative for nausea, vomiting and abdominal pain.  Genitourinary:       Neg - vaginal bleeding, discharge   Physical Exam   Blood pressure 117/71, pulse 88, temperature 99.3 F (37.4 C), temperature source Oral, resp. rate 18, height 6\' 1"  (1.854 m), weight 226 lb (102.513 kg), last menstrual period 03/22/2014, SpO2 100  %, unknown if currently breastfeeding.  Physical Exam  Constitutional: She is oriented to person, place, and time. She appears well-developed and well-nourished. No distress.  HENT:  Head: Normocephalic.  Right Ear: Tympanic membrane, external ear and ear canal normal.  Left Ear: Tympanic membrane, external ear and ear canal normal.  Nose: Mucosal edema and rhinorrhea present. Right sinus exhibits no maxillary sinus tenderness and no frontal sinus tenderness. Left sinus exhibits no maxillary sinus tenderness and no frontal sinus tenderness.  Mouth/Throat: Uvula is midline and mucous membranes are normal. Posterior oropharyngeal erythema present. No oropharyngeal exudate or posterior oropharyngeal edema.  Cardiovascular: Normal rate, regular rhythm and normal heart sounds.   Respiratory: Effort normal and breath sounds normal. She has no wheezes.  GI: Soft. She exhibits no distension and no mass. There is no tenderness. There is no rebound and no guarding.  Lymphadenopathy:       Head (right side): No submental, no submandibular and no tonsillar adenopathy present.       Head (left side): No submental, no submandibular and no tonsillar adenopathy present.    She has no cervical adenopathy.  Neurological: She is oriented to person, place, and time.  Skin: Skin is warm and dry. No erythema.  Psychiatric: She has a normal mood and affect.   Results for orders  placed or performed during the hospital encounter of 04/23/14 (from the past 24 hour(s))  Urinalysis, Routine w reflex microscopic     Status: Abnormal   Collection Time: 04/23/14 10:45 PM  Result Value Ref Range   Color, Urine YELLOW YELLOW   APPearance CLEAR CLEAR   Specific Gravity, Urine 1.025 1.005 - 1.030   pH 6.0 5.0 - 8.0   Glucose, UA NEGATIVE NEGATIVE mg/dL   Hgb urine dipstick NEGATIVE NEGATIVE   Bilirubin Urine NEGATIVE NEGATIVE   Ketones, ur NEGATIVE NEGATIVE mg/dL   Protein, ur NEGATIVE NEGATIVE mg/dL   Urobilinogen,  UA 0.2 0.0 - 1.0 mg/dL   Nitrite NEGATIVE NEGATIVE   Leukocytes, UA SMALL (A) NEGATIVE  Urine microscopic-add on     Status: Abnormal   Collection Time: 04/23/14 10:45 PM  Result Value Ref Range   Squamous Epithelial / LPF FEW (A) RARE   WBC, UA 7-10 <3 WBC/hpf   RBC / HPF 0-2 <3 RBC/hpf   Bacteria, UA FEW (A) RARE  Pregnancy, urine POC     Status: Abnormal   Collection Time: 04/23/14 10:54 PM  Result Value Ref Range   Preg Test, Ur POSITIVE (A) NEGATIVE     MAU Course  Procedures None  MDM +UPT UA today Patient is afebrile and has only had symptoms x 5 days. Advised most likely viral URI. List of OTC meds given, advised PO hydration and rest.  Assessment and Plan  A: Positive pregnancy test Viral acute URI  P: Discharge home List of OTC medications for URI given Patient advised to increase PO hydration and rest Patient plans to terminate pregnancy. Pregnancy confirmation letter given.  Patient may return to MAU as needed or if her condition were to change or worsen   Marny LowensteinJulie N Wenzel, PA-C  04/23/2014, 11:29 PM

## 2014-04-23 NOTE — MAU Note (Signed)
Pt reports she has been sick since Friday, symptoms include a cough, nasal congestion, some shortness of breath.

## 2014-04-26 NOTE — L&D Delivery Note (Signed)
Delivery Note At 5:55 PM a viable female was delivered via  (Presentation: ;  ).  APGAR: , ; weight  .   Placenta status: , .  Cord:  with the following complications: .  Cord pH: not done  Anesthesia:   Episiotomy:   Lacerations:   Suture Repair: 2.0 vicryl Est. Blood Loss (mL):    Mom to postpartum.  Baby to Couplet care / Skin to Skin.  Marchell Froman A 12/17/2014, 6:04 PM

## 2014-06-25 LAB — CYTOLOGY - PAP: Pap: NEGATIVE

## 2014-07-08 LAB — OB RESULTS CONSOLE GC/CHLAMYDIA
Chlamydia: NEGATIVE
GC PROBE AMP, GENITAL: NEGATIVE

## 2014-07-08 LAB — OB RESULTS CONSOLE ABO/RH: RH Type: POSITIVE

## 2014-07-08 LAB — OB RESULTS CONSOLE ANTIBODY SCREEN: Antibody Screen: NEGATIVE

## 2014-07-08 LAB — OB RESULTS CONSOLE RPR: RPR: NONREACTIVE

## 2014-07-08 LAB — OB RESULTS CONSOLE HEPATITIS B SURFACE ANTIGEN: HEP B S AG: NEGATIVE

## 2014-07-08 LAB — OB RESULTS CONSOLE HIV ANTIBODY (ROUTINE TESTING): HIV: NONREACTIVE

## 2014-07-08 LAB — OB RESULTS CONSOLE RUBELLA ANTIBODY, IGM: Rubella: IMMUNE

## 2014-07-21 ENCOUNTER — Inpatient Hospital Stay (HOSPITAL_COMMUNITY): Payer: Medicaid Other

## 2014-07-21 ENCOUNTER — Inpatient Hospital Stay (HOSPITAL_COMMUNITY)
Admission: AD | Admit: 2014-07-21 | Discharge: 2014-07-21 | Disposition: A | Discharge status: Home / Self Care | Payer: Medicaid Other | Source: Ambulatory Visit | Attending: Obstetrics | Admitting: Obstetrics

## 2014-07-21 DIAGNOSIS — Z3A17 17 weeks gestation of pregnancy: Secondary | ICD-10-CM | POA: Insufficient documentation

## 2014-07-21 DIAGNOSIS — N760 Acute vaginitis: Secondary | ICD-10-CM | POA: Insufficient documentation

## 2014-07-21 DIAGNOSIS — A499 Bacterial infection, unspecified: Secondary | ICD-10-CM | POA: Diagnosis not present

## 2014-07-21 DIAGNOSIS — B9689 Other specified bacterial agents as the cause of diseases classified elsewhere: Secondary | ICD-10-CM | POA: Diagnosis not present

## 2014-07-21 DIAGNOSIS — R109 Unspecified abdominal pain: Secondary | ICD-10-CM

## 2014-07-21 DIAGNOSIS — O23592 Infection of other part of genital tract in pregnancy, second trimester: Secondary | ICD-10-CM | POA: Diagnosis not present

## 2014-07-21 DIAGNOSIS — R102 Pelvic and perineal pain: Secondary | ICD-10-CM | POA: Diagnosis not present

## 2014-07-21 DIAGNOSIS — O26899 Other specified pregnancy related conditions, unspecified trimester: Secondary | ICD-10-CM | POA: Insufficient documentation

## 2014-07-21 DIAGNOSIS — O9989 Other specified diseases and conditions complicating pregnancy, childbirth and the puerperium: Secondary | ICD-10-CM | POA: Diagnosis not present

## 2014-07-21 DIAGNOSIS — R1030 Lower abdominal pain, unspecified: Secondary | ICD-10-CM | POA: Diagnosis present

## 2014-07-21 DIAGNOSIS — N949 Unspecified condition associated with female genital organs and menstrual cycle: Secondary | ICD-10-CM

## 2014-07-21 LAB — URINALYSIS, ROUTINE W REFLEX MICROSCOPIC
Bilirubin Urine: NEGATIVE
Glucose, UA: NEGATIVE mg/dL
HGB URINE DIPSTICK: NEGATIVE
KETONES UR: 15 mg/dL — AB
Leukocytes, UA: NEGATIVE
Nitrite: NEGATIVE
PH: 6 (ref 5.0–8.0)
Protein, ur: NEGATIVE mg/dL
Specific Gravity, Urine: <1.005 — ABNORMAL LOW (ref 1.005–1.030)
UROBILINOGEN UA: 0.2 mg/dL (ref 0.0–1.0)

## 2014-07-21 LAB — CBC
HEMATOCRIT: 30 % — AB (ref 36.0–46.0)
HEMOGLOBIN: 9.7 g/dL — AB (ref 12.0–15.0)
MCH: 22.9 pg — AB (ref 26.0–34.0)
MCHC: 32.3 g/dL (ref 30.0–36.0)
MCV: 70.9 fL — ABNORMAL LOW (ref 78.0–100.0)
Platelets: 310 10*3/uL (ref 150–400)
RBC: 4.23 MIL/uL (ref 3.87–5.11)
RDW: 18.3 % — ABNORMAL HIGH (ref 11.5–15.5)
WBC: 14.9 10*3/uL — ABNORMAL HIGH (ref 4.0–10.5)

## 2014-07-21 LAB — WET PREP, GENITAL
Trich, Wet Prep: NONE SEEN
Yeast Wet Prep HPF POC: NONE SEEN

## 2014-07-21 MED ORDER — METRONIDAZOLE 500 MG PO TABS: 500.0000 mg | ORAL_TABLET | Freq: Two times a day (BID) | ORAL | Status: DC

## 2014-07-21 MED ORDER — ACETAMINOPHEN-CODEINE #3 300-30 MG PO TABS
2.0000 | ORAL_TABLET | Freq: Once | ORAL | Status: AC
Start: 2014-07-21 — End: 2014-07-21
  Administered 2014-07-21: 2 via ORAL
  Filled 2014-07-21: qty 2

## 2014-07-21 NOTE — Discharge Instructions (Signed)

## 2014-07-21 NOTE — MAU Note (Signed)
Vaginal & abdominal pain that's constant x 1 hours. Denies vaginal bleeding or discharge.

## 2014-07-21 NOTE — MAU Provider Note (Signed)
History     CSN: [id number]  Arrival date and time: 07/21/14 0208   None     Chief Complaint  Patient presents with  . Abdominal Pain   HPI   Ms Jaclyn Tyler is a 24 y.o. female [phone] at [redacted]w[redacted]d who presents with abdominal pain. She was in an argument with the child's father over the phone and she became stressed out. A few minutes after she started experiencing bilateral lower abdominal pain; worse in the center of her lower abdomen. The pain is constant and sharp. She currently rates her pain 8/10. Sitting makes the pain worse; bringing her legs up toward her abdomen makes it feel better.  She has not taken anything for pain and denies pain medication.    OB History    Gravida Para Term Preterm AB TAB SAB Ectopic Multiple Living   Past Medical History  Diagnosis Date  . Gonorrhea   . Constipation   . Anemia     Past Surgical History  Procedure Laterality Date  . Wisdom tooth extraction      No family history on file.  History  Substance Use Topics  . Smoking status: Never Smoker   . Smokeless tobacco: Not on file  . Alcohol Use: Yes     Comment: occasion  LAST TIME-  04-02-2014    Allergies:  Allergies  Allergen Reactions  . Other Hives    Paprika seasoning    Prescriptions prior to admission  Medication Sig Dispense Refill Last Dose  . metroNIDAZOLE (FLAGYL) 500 MG tablet Take 1 tablet (500 mg total) by mouth 2 (two) times daily. 14 tablet 0 More than a month at Unknown time   Results for orders placed or performed during the hospital encounter of 07/21/14 (from the past 48 hour(s))  Wet prep, genital     Status: Abnormal   Collection Time: 07/21/14  2:45 AM  Result Value Ref Range   Yeast Wet Prep HPF POC NONE SEEN NONE SEEN   Trich, Wet Prep NONE SEEN NONE SEEN   Clue Cells Wet Prep HPF POC FEW (A) NONE SEEN   WBC, Wet Prep HPF POC MANY (A) NONE SEEN    Comment: MANY BACTERIA SEEN  CBC     Status: Abnormal   Collection  Time: 07/21/14  3:20 AM  Result Value Ref Range   WBC 14.9 (H) 4.0 - 10.5 K/uL   RBC 4.23 3.87 - 5.11 MIL/uL   Hemoglobin 9.7 (L) 12.0 - 15.0 g/dL   HCT 42.5 (L) 95.6 - 38.7 %   MCV 70.9 (L) 78.0 - 100.0 fL   MCH 22.9 (L) 26.0 - 34.0 pg   MCHC 32.3 30.0 - 36.0 g/dL   RDW 56.4 (H) 33.2 - 95.1 %   Platelets 310 150 - 400 K/uL           Review of Systems  Constitutional: Negative for fever and chills.  Gastrointestinal: Positive for abdominal pain. Negative for nausea and vomiting.  Genitourinary: Negative for dysuria, urgency, frequency and hematuria.       Denies vaginal bleeding    Physical Exam   Blood pressure 124/79, pulse 131, temperature 98.7 F (37.1 C), temperature source Oral, resp. rate 20, height  (1.854 m), weight 101.515 kg (223 lb 12.8 oz), last menstrual period 03/22/2014, SpO2 100 %, not currently breastfeeding.  Physical Exam  Constitutional: She is oriented to person, place,  and time. She appears well-developed and well-nourished. No distress.  GI: Soft. There is generalized tenderness.  Genitourinary:  Speculum exam: Vagina - Small amount of creamy, pale yellow discharge, no odor Cervix - No contact bleeding, no CMT  Bimanual exam: Cervix closed Uterus non tender Adnexa non tender, no masses bilaterally GC/Chlam, wet prep done Chaperone present for exam.  Neurological: She is alert and oriented to person, place, and time.  Skin: Skin is warm. She is not diaphoretic.  Psychiatric: Her mood appears anxious. She is agitated.    MAU Course  Procedures  None  MDM  Tylenol 3 given 2 tabs; pain improved   Assessment and Plan   A:  1. BV (bacterial vaginosis)   2. Round ligament pain    P;  Discharge home in stable condition RX: Flagyl  Return to MAU if symptoms worsen  Follow up with OB as scheduled    Jaclyn LopeJennifer I Monetta Lick, NP 07/21/2014 2:46 AM

## 2014-07-22 LAB — HIV ANTIBODY (ROUTINE TESTING W REFLEX): HIV Screen 4th Generation wRfx: NONREACTIVE

## 2014-07-22 LAB — GC/CHLAMYDIA PROBE AMP (~~LOC~~) NOT AT ARMC
Chlamydia: POSITIVE — AB
NEISSERIA GONORRHEA: NEGATIVE

## 2014-07-23 ENCOUNTER — Telehealth (HOSPITAL_COMMUNITY): Payer: Self-pay | Admitting: *Deleted

## 2014-07-23 DIAGNOSIS — A749 Chlamydial infection, unspecified: Secondary | ICD-10-CM

## 2014-07-23 MED ORDER — AZITHROMYCIN 500 MG PO TABS: ORAL_TABLET | ORAL | Status: DC

## 2014-07-23 NOTE — Telephone Encounter (Signed)
Telephone call to patient regarding positive chlamydia culture, patient notified.  Patient has not been treated.  Rx called in per protocol to patient's pharmacy.  Instructed patient to notify her partner for treatment.  Report faxed to health department.

## 2014-07-26 DIAGNOSIS — O26899 Other specified pregnancy related conditions, unspecified trimester: Secondary | ICD-10-CM | POA: Insufficient documentation

## 2014-07-26 DIAGNOSIS — Z3A17 17 weeks gestation of pregnancy: Secondary | ICD-10-CM | POA: Insufficient documentation

## 2014-07-26 DIAGNOSIS — R109 Unspecified abdominal pain: Secondary | ICD-10-CM

## 2014-09-28 ENCOUNTER — Inpatient Hospital Stay (HOSPITAL_COMMUNITY)
Admission: AD | Admit: 2014-09-28 | Discharge: 2014-09-28 | Disposition: A | Discharge status: Home / Self Care | Payer: Medicaid Other | Source: Ambulatory Visit | Attending: Obstetrics | Admitting: Obstetrics

## 2014-09-28 DIAGNOSIS — O2342 Unspecified infection of urinary tract in pregnancy, second trimester: Secondary | ICD-10-CM | POA: Diagnosis not present

## 2014-09-28 DIAGNOSIS — E860 Dehydration: Secondary | ICD-10-CM

## 2014-09-28 DIAGNOSIS — Z3A27 27 weeks gestation of pregnancy: Secondary | ICD-10-CM | POA: Diagnosis not present

## 2014-09-28 DIAGNOSIS — R102 Pelvic and perineal pain: Secondary | ICD-10-CM | POA: Diagnosis not present

## 2014-09-28 DIAGNOSIS — N9089 Other specified noninflammatory disorders of vulva and perineum: Secondary | ICD-10-CM | POA: Diagnosis present

## 2014-09-28 LAB — URINALYSIS, ROUTINE W REFLEX MICROSCOPIC
BILIRUBIN URINE: NEGATIVE
Glucose, UA: NEGATIVE mg/dL
Hgb urine dipstick: NEGATIVE
Ketones, ur: 15 mg/dL — AB
Nitrite: NEGATIVE
PROTEIN: NEGATIVE mg/dL
Specific Gravity, Urine: 1.025 (ref 1.005–1.030)
UROBILINOGEN UA: 1 mg/dL (ref 0.0–1.0)
pH: 6 (ref 5.0–8.0)

## 2014-09-28 LAB — URINE MICROSCOPIC-ADD ON

## 2014-09-28 MED ORDER — NITROFURANTOIN MONOHYD MACRO 100 MG PO CAPS: 100.0000 mg | ORAL_CAPSULE | Freq: Two times a day (BID) | ORAL | Status: DC

## 2014-09-28 NOTE — Discharge Instructions (Signed)
Change back to [facility] Soap.  Do not use Darene LamerNair.  Wear loose clothing and no underwear at bedtime. Drink 8 glasses of water daily. Keep your appointment on Thursday. Get your prescription at the pharmacy on the way home and begin today.

## 2014-09-28 NOTE — MAU Provider Note (Signed)
History     CSN: [id number]  Arrival date and time: 09/28/14 1752  Seen by provider at 1825    Chief Complaint  Patient presents with  . Pain    pain and swelling in external genitalia   HPI Jaclyn Tyler 24 y.o.  4811w1d  Patient was experiencing vulvar edema yesterday and was unsure why.  Today the edema was improved and she decided to use Darene LamerNair on her pubic area.  Then the area was much more edematous and tender.  Noticed some pink blood on the tissue when she wiped earlier today before the [employer].   Changed her soap recently to [place] - was using [employer]ove.  Denies having any sex in 2 months.  Denies any pain on the inner vaginal tissue.  OB History    Gravida Para Term Preterm AB TAB SAB Ectopic Multiple Living   5 2 1  2  2   2       Past Medical History  Diagnosis Date  . Gonorrhea   . Constipation   . Anemia     Past Surgical History  Procedure Laterality Date  . Wisdom tooth extraction      No family history on file.  History  Substance Use Topics  . Smoking status: Never Smoker   . Smokeless tobacco: Not on file  . Alcohol Use: Yes     Comment: occasion  LAST TIME-  04-02-2014    Allergies:  Allergies  Allergen Reactions  . Other Hives and Other (See Comments)    Pt is allergic to paprika seasoning and mushrooms.      Prescriptions prior to admission  Medication Sig Dispense Refill Last Dose  . ferrous sulfate 325 (65 FE) MG tablet Take 325 mg by mouth daily.   09/27/2014 at Unknown time  . Prenatal Vit-Min-FA-Fish Oil ([facility] PRENATAL GUMMY PO) Take 2 each by mouth daily.   09/27/2014 at Unknown time  . azithromycin (ZITHROMAX) 500 MG tablet Take 2 tablets once (Patient not taking: Reported on 09/28/2014) 2 tablet 0   . metroNIDAZOLE (FLAGYL) 500 MG tablet Take 1 tablet (500 mg total) by mouth 2 (two) times daily. (Patient not taking: Reported on 09/28/2014) 14 tablet 0     Review of Systems  Constitutional: Negative for fever.  Gastrointestinal: Negative for  nausea, vomiting and abdominal pain.  Genitourinary:       Swollen vulva No vaginal discharge. Bleeding when wiping.  No blood in clothing. Burning with urination - on vulva.   Physical Exam   Last menstrual period 03/22/2014.  Physical Exam  Nursing note and vitals reviewed. Constitutional: She is oriented to person, place, and time. She appears well-developed and well-nourished.  HENT:  Head: Normocephalic.  Eyes: EOM are normal.  Neck: Neck supple.  GI: Soft. There is no tenderness.  FHT baseline 150 with moderate variability. No contractions on the monitor.  Genitourinary:  External vulva exam: No hair seen on mons. Hair noted just above clitoris and below on labia majora No lesions seen No broken skin. Vaginal tissue at introitus nontender and normal in color. No vaginal discharge seen. Clitoris is prominent but hood is not edematous. Clitoris and surrounding tissue is tender to palpation. Folds of tissue and labia majora near the introitus are tender to palpation.   Musculoskeletal: Normal range of motion.  Neurological: She is alert and oriented to person, place, and time.  Skin: Skin is warm and dry.  Psychiatric: She has a normal mood and affect.  MAU Course  Procedures Results for orders placed or performed during the hospital encounter of 09/28/14 (from the past 24 hour(s))  Urinalysis, Routine w reflex microscopic (not at [facility])     Status: Abnormal   Collection Time: 09/28/14  6:10 PM  Result Value Ref Range   Color, Urine YELLOW YELLOW   APPearance HAZY (A) CLEAR   Specific Gravity, Urine 1.025 1.005 - 1.030   pH 6.0 5.0 - 8.0   Glucose, UA NEGATIVE NEGATIVE mg/dL   Hgb urine dipstick NEGATIVE NEGATIVE   Bilirubin Urine NEGATIVE NEGATIVE   Ketones, ur 15 (A) NEGATIVE mg/dL   Protein, ur NEGATIVE NEGATIVE mg/dL   Urobilinogen, UA 1.0 0.0 - 1.0 mg/dL   Nitrite NEGATIVE NEGATIVE   Leukocytes, UA LARGE (A) NEGATIVE  Urine microscopic-add on      Status: Abnormal   Collection Time: 09/28/14  6:10 PM  Result Value Ref Range   Squamous Epithelial / LPF FEW (A) RARE   WBC, UA 21-50 <3 WBC/hpf   Bacteria, UA FEW (A) RARE   Crystals CA OXALATE CRYSTALS (A) NEGATIVE    MDM Client questioning whether she has a UTI.  Informed a UTI is likely not causing her symptoms externally.  Advised that some feeling of being swollen may be more pelvic congestion and normal enlargement of the tissues.  After the Darene Lamer, she may have a very mild contact dermatitis from exposure to the chemicals of the hair removal product.  Will evaluate for UTI as client saw some pink blood on the tissue earlier today after urinating.  Has an appointment in the office on Thursday.  Assessment and Plan  Vulvar pain UTI Mild dehydration  Plan Change back to [facility].  Do not use Darene Lamer.  Wear loose clothing and no underwear at bedtime. Drink 8 glasses of water daily. Keep your appointment on Thursday. RX Macrobid 100 mg PO BID x 7 days.  Arley Garant 09/28/2014, 6:30 PM

## 2014-09-29 LAB — CULTURE, OB URINE
Colony Count: NO GROWTH
Culture: NO GROWTH

## 2014-09-30 ENCOUNTER — Telehealth (HOSPITAL_COMMUNITY): Payer: Self-pay

## 2014-10-01 ENCOUNTER — Inpatient Hospital Stay (HOSPITAL_COMMUNITY)
Admission: AD | Admit: 2014-10-01 | Discharge: 2014-10-01 | Disposition: A | Discharge status: Home / Self Care | Payer: Medicaid Other | Source: Ambulatory Visit | Attending: Obstetrics | Admitting: Obstetrics

## 2014-10-01 ENCOUNTER — Encounter (HOSPITAL_COMMUNITY): Payer: Self-pay | Admitting: *Deleted

## 2014-10-01 DIAGNOSIS — B373 Candidiasis of vulva and vagina: Secondary | ICD-10-CM

## 2014-10-01 DIAGNOSIS — R102 Pelvic and perineal pain: Secondary | ICD-10-CM | POA: Diagnosis present

## 2014-10-01 DIAGNOSIS — O9989 Other specified diseases and conditions complicating pregnancy, childbirth and the puerperium: Secondary | ICD-10-CM | POA: Insufficient documentation

## 2014-10-01 DIAGNOSIS — B3731 Acute candidiasis of vulva and vagina: Secondary | ICD-10-CM

## 2014-10-01 DIAGNOSIS — N898 Other specified noninflammatory disorders of vagina: Secondary | ICD-10-CM | POA: Insufficient documentation

## 2014-10-01 DIAGNOSIS — Z3A27 27 weeks gestation of pregnancy: Secondary | ICD-10-CM | POA: Diagnosis not present

## 2014-10-01 LAB — WET PREP, GENITAL
Clue Cells Wet Prep HPF POC: NONE SEEN
TRICH WET PREP: NONE SEEN

## 2014-10-01 LAB — URINALYSIS, ROUTINE W REFLEX MICROSCOPIC
Bilirubin Urine: NEGATIVE
Glucose, UA: NEGATIVE mg/dL
Hgb urine dipstick: NEGATIVE
Ketones, ur: NEGATIVE mg/dL
Nitrite: NEGATIVE
PH: 7 (ref 5.0–8.0)
Protein, ur: NEGATIVE mg/dL
Specific Gravity, Urine: 1.025 (ref 1.005–1.030)
Urobilinogen, UA: 0.2 mg/dL (ref 0.0–1.0)

## 2014-10-01 LAB — URINE MICROSCOPIC-ADD ON

## 2014-10-01 MED ORDER — FLUCONAZOLE 150 MG PO TABS: 150.0000 mg | ORAL_TABLET | ORAL | Status: DC

## 2014-10-01 NOTE — Discharge Instructions (Signed)

## 2014-10-01 NOTE — MAU Note (Signed)
Pt was seen on Sat for a vaginal infection.  Pt states she has been taking prescribed RX as indicated but feel like the infection is getting worse.  Pt states her labia is really irritated, raw, and now has bumps on it.  Good fetal movement.  Denies vaginal bleeding but does say she's experiencing a greenish/yellow discharge.

## 2014-10-01 NOTE — MAU Provider Note (Signed)
History     CSN: [id number]  Arrival date and time: 10/01/14 1526   First Provider Initiated Contact with Patient 10/01/14 1631      Chief Complaint  Patient presents with  . Vaginal Pain   HPI Ms. Skare is a 24 y.o. [id number] at [redacted]w[redacted]d presenting with complaint of vaginal irritation and discharge. She was seen three days ago with complaint of irritation and swelling after using Darene Lamer. No significant skin reaction seen on exam at that time. She was found to have a UTI and started on treatment with macrobid. Patient has been taking antibiotics twice daily. Over the last two days, she has had increasing irritation of perineum and vulvar skin. She notes a copious amount of thick discharge that is sometimes green in color. Skin and vaginal tissue feels very irritated. Some itching. No dysuria or hematuria. No large gush of fluid or vaginal bleeding. Continues to feel regular fetal movement. No contractions. Last sexual activity or anything in the vagina was 3 months ago. She was treated for chlamydia in 06/2014. She thinks that test of cure was obtained at her OB visit but hasn't heard results.   OB History    Gravida Para Term Preterm AB TAB SAB Ectopic Multiple Living   Past Medical History  Diagnosis Date  . Gonorrhea   . Constipation   . Anemia     Past Surgical History  Procedure Laterality Date  . Wisdom tooth extraction      History reviewed. No pertinent family history.  History  Substance Use Topics  . Smoking status: Never Smoker   . Smokeless tobacco: Not on file  . Alcohol Use: Yes     Comment: occasion  LAST TIME-  04-02-2014    Allergies:  Allergies  Allergen Reactions  . Other Hives and Other (See Comments)    Pt is allergic to paprika seasoning and mushrooms.      Prescriptions prior to admission  Medication Sig Dispense Refill Last Dose  . ferrous sulfate 325 (65 FE) MG tablet Take 325 mg by mouth daily.   09/30/2014 at Unknown time   . nitrofurantoin, macrocrystal-monohydrate, (MACROBID) 100 MG capsule Take 1 capsule (100 mg total) by mouth 2 (two) times daily. 14 capsule 0 09/30/2014 at Unknown time  . Prenatal Vit-Min-FA-Fish Oil ([facility] PRENATAL GUMMY PO) Take 2 each by mouth daily.   10/01/2014 at Unknown time    Review of Systems  Constitutional: Negative for fever.  Gastrointestinal: Negative for nausea, vomiting and abdominal pain.  Genitourinary: Negative for dysuria, urgency, frequency and hematuria.  Neurological: Positive for headaches.   Physical Exam   Blood pressure 117/70, pulse 97, temperature 97.7 F (36.5 C), temperature source Oral, resp. rate 18, last menstrual period 03/22/2014, SpO2 98 %.  Physical Exam  Constitutional: She appears well-developed and well-nourished. No distress.  Genitourinary:  External genitalia with mild irritation with some areas of superficial excoriation. No lesions. Mild edema of labia majora. Vaginal mucosa normal in appearance. Vaginal vault with moderate amount of white discharge. SVE with cervix long, closed, high    MAU Course  Procedures  MDM 23 y.o. [id number] at [redacted]w[redacted]d presenting with persistent vaginal discharge and perineal irritation. This occurs in the setting of use of nair and new soap several days ago as well as antibiotic treatment for UTI. Physical exam without significant edema of perineal or vaginal tissue with with some superficial irritation. Moderate  amount of discharge. Will send wet prep and urinalysis. Will also send GC/Chlamydia to ensure TOC as patient is not sure.   Assessment and Plan  A: 23 y.o. [id number] at 6473w4d with vaginal irritation and discharge. Wet prep shows yeast and large LE. Urinalysis shows large LE but negative nitrite, mucous present and few squamous cells.   P:  Discharge to home with regular OB f/u Continue antibiotics to complete course for prior UTI Oral diflucan sent to patient's pharmacy Topical hydrocortisone to use for 7 days  or less for external irritation  Patient history, exam, assessment and plan discussed with [facility] Williams,CNM    Hollee Fate T Jariya Reichow 10/01/2014, 4:32 PM

## 2014-10-02 LAB — GC/CHLAMYDIA PROBE AMP (~~LOC~~) NOT AT ARMC
CHLAMYDIA, DNA PROBE: NEGATIVE
NEISSERIA GONORRHEA: NEGATIVE

## 2014-11-28 LAB — OB RESULTS CONSOLE GBS: GBS: POSITIVE

## 2014-12-12 ENCOUNTER — Encounter (HOSPITAL_COMMUNITY): Payer: Self-pay

## 2014-12-12 ENCOUNTER — Inpatient Hospital Stay (HOSPITAL_COMMUNITY)
Admission: AD | Admit: 2014-12-12 | Discharge: 2014-12-12 | Disposition: A | Discharge status: Home / Self Care | Payer: Medicaid Other | Source: Ambulatory Visit | Attending: Obstetrics | Admitting: Obstetrics

## 2014-12-12 ENCOUNTER — Inpatient Hospital Stay (HOSPITAL_COMMUNITY): Payer: Medicaid Other

## 2014-12-12 ENCOUNTER — Telehealth (HOSPITAL_COMMUNITY): Payer: Self-pay | Admitting: *Deleted

## 2014-12-12 ENCOUNTER — Encounter (HOSPITAL_COMMUNITY): Payer: Self-pay | Admitting: *Deleted

## 2014-12-12 DIAGNOSIS — Z3A37 37 weeks gestation of pregnancy: Secondary | ICD-10-CM | POA: Insufficient documentation

## 2014-12-12 DIAGNOSIS — O368130 Decreased fetal movements, third trimester, not applicable or unspecified: Secondary | ICD-10-CM | POA: Insufficient documentation

## 2014-12-12 DIAGNOSIS — O368131 Decreased fetal movements, third trimester, fetus 1: Secondary | ICD-10-CM

## 2014-12-12 DIAGNOSIS — IMO0002 Reserved for concepts with insufficient information to code with codable children: Secondary | ICD-10-CM

## 2014-12-12 DIAGNOSIS — O368190 Decreased fetal movements, unspecified trimester, not applicable or unspecified: Secondary | ICD-10-CM | POA: Diagnosis present

## 2014-12-12 NOTE — MAU Provider Note (Signed)
Chief Complaint:  Non-stress Test   First Provider Initiated Contact with Patient 12/12/14 1148      HPI: Jaclyn Tyler is a 24 y.o. [id number] at [redacted]w[redacted]d who was sent to maternity admissions for NST for decreased fetal mvmt since yesterday.  No associated contractions, leakage of fluid or vaginal bleeding.   Past Medical History: Past Medical History  Diagnosis Date  . Gonorrhea   . Constipation   . Anemia     Past obstetric history: OB History  Gravida Para Term Preterm AB SAB TAB Ectopic Multiple Living  5 2 2  2 2    4     # Outcome Date GA Lbr Len/2nd Weight Sex Delivery Anes PTL Lv  5 Current           4 Term 2011 [redacted]w[redacted]d  7 lb 2 oz (3.232 kg) F Vag-Spont   Y  3 Term 2009 [redacted]w[redacted]d  7 lb 5 oz (3.317 kg) F Vag-Spont   Y  2 SAB              Comments: System Generated. Please review and update pregnancy details.  1 SAB               Past Surgical History: Past Surgical History  Procedure Laterality Date  . Wisdom tooth extraction       Family History: History reviewed. No pertinent family history.  Social History: Social History  Substance Use Topics  . Smoking status: Never Smoker   . Smokeless tobacco: None  . Alcohol Use: Yes     Comment: occasion  LAST TIME-  04-02-2014    Allergies:  Allergies  Allergen Reactions  . Other Hives and Other (See Comments)    Pt is allergic to paprika seasoning and mushrooms.      Meds:  Prescriptions prior to admission  Medication Sig Dispense Refill Last Dose  . acetaminophen (TYLENOL) 325 MG tablet Take 325 mg by mouth every 6 (six) hours as needed for mild pain.   Past Week at Unknown time  . Prenatal Vit-Min-FA-Fish Oil ([facility] PRENATAL GUMMY PO) Take 2 each by mouth daily.   12/12/2014 at Unknown time    I have reviewed patient's Past Medical Hx, Surgical Hx, Family Hx, Social Hx, medications and allergies.   ROS:  Review of Systems  Constitutional: Negative for fever and chills.  Genitourinary: Negative for vaginal  bleeding.       Neg for LOF. Pos for decreased FM.     Physical Exam   Patient Vitals for the past 24 hrs:  BP Temp Temp src Pulse Resp  12/12/14 1130 107/74 mmHg 98 F (36.7 C) Oral (!) 122 18   Constitutional: Well-developed, well-nourished female in no acute distress.  Cardiovascular: normal rate Respiratory: normal effort GI: Abd soft, non-tender, gravid appropriate for gestational age.  MS: Extremities nontender, no edema, normal ROM Neurologic: Alert and oriented x 4.  GU: Deferred FHT:  Baseline 160 , moderate variability, accelerations present, no decelerations Contractions: None   Labs: No results found for this or any previous visit (from the past 24 hour(s)).  Imaging:  BPP 8/8  MAU Course: FHR on-reactive on prolonged monitoring in MAU. Sent for BPP  BPP 8/8. Discussed NRNST and BPP w/ Dr. Gaynell Face. NST scheduled for 8/20 and IOL scheduled for 8/23.  MDM:  Assessment: 1. Decreased fetal movement, third trimester, fetus 1   2. Fetal heart rate nonreactive     Plan: Discharge home in stable condition.  Labor  precautions and fetal kick counts     Follow-up Information    Follow up with [facility] [place] On 12/14/2014.   Why:  Non stress test   Contact information:   [address] [id number] mc [place] [address] [phone]        Medication List    TAKE these medications        acetaminophen 325 MG tablet  Commonly known as:  TYLENOL  Take 325 mg by mouth every 6 (six) hours as needed for mild pain.     [facility] PRENATAL GUMMY PO  Take 2 each by mouth daily.        [place], CNM 12/12/2014 1:40 PM

## 2014-12-12 NOTE — MAU Note (Signed)
Pt sent in for NST for decreased fetal movement. Reports not feeling baby move much since yesterday.

## 2014-12-12 NOTE — Discharge Instructions (Signed)

## 2014-12-12 NOTE — Telephone Encounter (Signed)
Preadmission screen  

## 2014-12-17 ENCOUNTER — Encounter (HOSPITAL_COMMUNITY): Payer: Self-pay

## 2014-12-17 ENCOUNTER — Inpatient Hospital Stay (HOSPITAL_COMMUNITY): Payer: Medicaid Other | Admitting: Anesthesiology

## 2014-12-17 ENCOUNTER — Inpatient Hospital Stay (HOSPITAL_COMMUNITY)
Admission: RE | Admit: 2014-12-17 | Discharge: 2014-12-19 | DRG: 775 | Disposition: A | Discharge status: Home / Self Care | Payer: Medicaid Other | Source: Ambulatory Visit | Attending: Obstetrics | Admitting: Obstetrics

## 2014-12-17 DIAGNOSIS — K219 Gastro-esophageal reflux disease without esophagitis: Secondary | ICD-10-CM | POA: Diagnosis present

## 2014-12-17 DIAGNOSIS — Z3A39 39 weeks gestation of pregnancy: Secondary | ICD-10-CM | POA: Diagnosis present

## 2014-12-17 DIAGNOSIS — O9902 Anemia complicating childbirth: Secondary | ICD-10-CM | POA: Diagnosis present

## 2014-12-17 DIAGNOSIS — Z3490 Encounter for supervision of normal pregnancy, unspecified, unspecified trimester: Secondary | ICD-10-CM

## 2014-12-17 DIAGNOSIS — O99824 Streptococcus B carrier state complicating childbirth: Secondary | ICD-10-CM | POA: Diagnosis present

## 2014-12-17 HISTORY — DX: Gastro-esophageal reflux disease without esophagitis: K21.9

## 2014-12-17 HISTORY — DX: Anxiety disorder, unspecified: F41.9

## 2014-12-17 LAB — CBC
HCT: 31 % — ABNORMAL LOW (ref 36.0–46.0)
HEMOGLOBIN: 9.7 g/dL — AB (ref 12.0–15.0)
MCH: 21.6 pg — ABNORMAL LOW (ref 26.0–34.0)
MCHC: 31.3 g/dL (ref 30.0–36.0)
MCV: 69 fL — AB (ref 78.0–100.0)
PLATELETS: 318 10*3/uL (ref 150–400)
RBC: 4.49 MIL/uL (ref 3.87–5.11)
RDW: 18.4 % — ABNORMAL HIGH (ref 11.5–15.5)
WBC: 13.3 10*3/uL — AB (ref 4.0–10.5)

## 2014-12-17 LAB — TYPE AND SCREEN
ABO/RH(D): A POS
Antibody Screen: NEGATIVE

## 2014-12-17 MED ORDER — SENNOSIDES-DOCUSATE SODIUM 8.6-50 MG PO TABS
2.0000 | ORAL_TABLET | ORAL | Status: DC
  Administered 2014-12-19: 2 via ORAL
  Filled 2014-12-17 (×2): qty 2

## 2014-12-17 MED ORDER — ONDANSETRON HCL 4 MG/2ML IJ SOLN: 4.0000 mg | Freq: Four times a day (QID) | INTRAMUSCULAR | Status: DC | PRN

## 2014-12-17 MED ORDER — IBUPROFEN 600 MG PO TABS
600.0000 mg | ORAL_TABLET | Freq: Four times a day (QID) | ORAL | Status: DC
  Administered 2014-12-17 – 2014-12-19 (×6): 600 mg via ORAL
  Filled 2014-12-17 (×7): qty 1

## 2014-12-17 MED ORDER — EPHEDRINE 5 MG/ML INJ: 10.0000 mg | INTRAVENOUS | Status: DC | PRN

## 2014-12-17 MED ORDER — LANOLIN HYDROUS EX OINT: TOPICAL_OINTMENT | CUTANEOUS | Status: DC | PRN

## 2014-12-17 MED ORDER — DIBUCAINE 1 % RE OINT
1.0000 "application " | TOPICAL_OINTMENT | RECTAL | Status: DC | PRN
  Filled 2014-12-17: qty 28

## 2014-12-17 MED ORDER — TETANUS-DIPHTH-ACELL PERTUSSIS 5-2.5-18.5 LF-MCG/0.5 IM SUSP
0.5000 mL | Freq: Once | INTRAMUSCULAR | Status: AC
  Administered 2014-12-18: 0.5 mL via INTRAMUSCULAR
  Filled 2014-12-17 (×2): qty 0.5

## 2014-12-17 MED ORDER — TERBUTALINE SULFATE 1 MG/ML IJ SOLN: 0.2500 mg | Freq: Once | INTRAMUSCULAR | Status: DC | PRN

## 2014-12-17 MED ORDER — ACETAMINOPHEN 325 MG PO TABS: 650.0000 mg | ORAL_TABLET | ORAL | Status: DC | PRN

## 2014-12-17 MED ORDER — DIPHENHYDRAMINE HCL 50 MG/ML IJ SOLN: 12.5000 mg | INTRAMUSCULAR | Status: DC | PRN

## 2014-12-17 MED ORDER — PENICILLIN G POTASSIUM 5000000 UNITS IJ SOLR
2.5000 10*6.[IU] | INTRAVENOUS | Status: DC
  Filled 2014-12-17 (×3): qty 2.5

## 2014-12-17 MED ORDER — CITRIC ACID-SODIUM CITRATE 334-500 MG/5ML PO SOLN: 30.0000 mL | ORAL | Status: DC | PRN

## 2014-12-17 MED ORDER — FENTANYL 2.5 MCG/ML BUPIVACAINE 1/10 % EPIDURAL INFUSION (WH - ANES)
14.0000 mL/h | INTRAMUSCULAR | Status: DC | PRN
  Administered 2014-12-17: 14 mL/h via EPIDURAL
  Filled 2014-12-17: qty 125

## 2014-12-17 MED ORDER — ACETAMINOPHEN 325 MG PO TABS
650.0000 mg | ORAL_TABLET | ORAL | Status: DC | PRN
  Administered 2014-12-18 (×2): 650 mg via ORAL
  Filled 2014-12-17 (×2): qty 2

## 2014-12-17 MED ORDER — LIDOCAINE HCL (PF) 1 % IJ SOLN
30.0000 mL | INTRAMUSCULAR | Status: DC | PRN
  Filled 2014-12-17: qty 30

## 2014-12-17 MED ORDER — OXYCODONE-ACETAMINOPHEN 5-325 MG PO TABS: 2.0000 | ORAL_TABLET | ORAL | Status: DC | PRN

## 2014-12-17 MED ORDER — SIMETHICONE 80 MG PO CHEW: 80.0000 mg | CHEWABLE_TABLET | ORAL | Status: DC | PRN

## 2014-12-17 MED ORDER — LIDOCAINE HCL (PF) 1 % IJ SOLN
INTRAMUSCULAR | Status: DC | PRN
  Administered 2014-12-17 (×2): 4 mL

## 2014-12-17 MED ORDER — BENZOCAINE-MENTHOL 20-0.5 % EX AERO
1.0000 "application " | INHALATION_SPRAY | CUTANEOUS | Status: DC | PRN
  Administered 2014-12-17: 1 via TOPICAL
  Filled 2014-12-17 (×2): qty 56

## 2014-12-17 MED ORDER — DIPHENHYDRAMINE HCL 25 MG PO CAPS: 25.0000 mg | ORAL_CAPSULE | Freq: Four times a day (QID) | ORAL | Status: DC | PRN

## 2014-12-17 MED ORDER — OXYTOCIN 40 UNITS IN LACTATED RINGERS INFUSION - SIMPLE MED
INTRAVENOUS | Status: AC
  Filled 2014-12-17: qty 1000

## 2014-12-17 MED ORDER — OXYTOCIN 40 UNITS IN LACTATED RINGERS INFUSION - SIMPLE MED
62.5000 mL/h | INTRAVENOUS | Status: DC
  Administered 2014-12-17: 62.5 mL/h via INTRAVENOUS

## 2014-12-17 MED ORDER — PHENYLEPHRINE 40 MCG/ML (10ML) SYRINGE FOR IV PUSH (FOR BLOOD PRESSURE SUPPORT)
80.0000 ug | PREFILLED_SYRINGE | INTRAVENOUS | Status: DC | PRN
  Filled 2014-12-17: qty 20

## 2014-12-17 MED ORDER — OXYCODONE-ACETAMINOPHEN 5-325 MG PO TABS: 1.0000 | ORAL_TABLET | ORAL | Status: DC | PRN

## 2014-12-17 MED ORDER — OXYTOCIN 40 UNITS IN LACTATED RINGERS INFUSION - SIMPLE MED
1.0000 m[IU]/min | INTRAVENOUS | Status: DC
  Administered 2014-12-17: 2 m[IU]/min via INTRAVENOUS

## 2014-12-17 MED ORDER — ONDANSETRON HCL 4 MG/2ML IJ SOLN: 4.0000 mg | INTRAMUSCULAR | Status: DC | PRN

## 2014-12-17 MED ORDER — ZOLPIDEM TARTRATE 5 MG PO TABS: 5.0000 mg | ORAL_TABLET | Freq: Every evening | ORAL | Status: DC | PRN

## 2014-12-17 MED ORDER — OXYTOCIN BOLUS FROM INFUSION
500.0000 mL | INTRAVENOUS | Status: DC
  Administered 2014-12-17: 500 mL via INTRAVENOUS

## 2014-12-17 MED ORDER — FERROUS SULFATE 325 (65 FE) MG PO TABS
325.0000 mg | ORAL_TABLET | Freq: Two times a day (BID) | ORAL | Status: DC
  Administered 2014-12-18 – 2014-12-19 (×3): 325 mg via ORAL
  Filled 2014-12-17 (×4): qty 1

## 2014-12-17 MED ORDER — PRENATAL MULTIVITAMIN CH
1.0000 | ORAL_TABLET | Freq: Every day | ORAL | Status: DC
  Administered 2014-12-18 – 2014-12-19 (×2): 1 via ORAL
  Filled 2014-12-17 (×2): qty 1

## 2014-12-17 MED ORDER — LACTATED RINGERS IV SOLN
INTRAVENOUS | Status: DC
  Administered 2014-12-17 (×2): via INTRAVENOUS

## 2014-12-17 MED ORDER — PENICILLIN G POTASSIUM 5000000 UNITS IJ SOLR
5.0000 10*6.[IU] | Freq: Once | INTRAVENOUS | Status: AC
  Administered 2014-12-17: 5 10*6.[IU] via INTRAVENOUS
  Filled 2014-12-17: qty 5

## 2014-12-17 MED ORDER — LACTATED RINGERS IV SOLN
500.0000 mL | INTRAVENOUS | Status: DC | PRN
  Administered 2014-12-17: 1000 mL via INTRAVENOUS

## 2014-12-17 MED ORDER — BUTORPHANOL TARTRATE 1 MG/ML IJ SOLN: 1.0000 mg | INTRAMUSCULAR | Status: DC | PRN

## 2014-12-17 MED ORDER — WITCH HAZEL-GLYCERIN EX PADS: 1.0000 "application " | MEDICATED_PAD | CUTANEOUS | Status: DC | PRN

## 2014-12-17 MED ORDER — ONDANSETRON HCL 4 MG PO TABS: 4.0000 mg | ORAL_TABLET | ORAL | Status: DC | PRN

## 2014-12-17 MED ORDER — FLEET ENEMA 7-19 GM/118ML RE ENEM: 1.0000 | ENEMA | RECTAL | Status: DC | PRN

## 2014-12-17 MED ORDER — PENICILLIN G POTASSIUM 5000000 UNITS IJ SOLR
2.5000 10*6.[IU] | INTRAVENOUS | Status: DC
  Administered 2014-12-17: 2.5 10*6.[IU] via INTRAVENOUS
  Filled 2014-12-17 (×4): qty 2.5

## 2014-12-17 NOTE — H&P (Signed)
This is Dr. Francoise Ceo dictating the history and physical on  Jaclyn Tyler she is a 24 year old gravida 5 para 2022 at 78 weeks EDC 8:30 Positive GBS patient desires induction cervix is 2 cm 80% vertex -2 station and she is getting her penicillin for her positive GBS Past medical history negative Past surgical history negative Social history negative System review negative Physical exam well-developed female not in labor HEENT negative Lungs clear to P&A Heart regular rhythm no murmurs no gallops Breasts negative Abdomen term Pelvic as described above Extremities negative

## 2014-12-17 NOTE — Anesthesia Procedure Notes (Signed)
Epidural Patient location during procedure: OB  Staffing Anesthesiologist: Loris Seelye Performed by: anesthesiologist   Preanesthetic Checklist Completed: patient identified, site marked, surgical consent, pre-op evaluation, timeout performed, IV checked, risks and benefits discussed and monitors and equipment checked  Epidural Patient position: sitting Prep: site prepped and draped and DuraPrep Patient monitoring: continuous pulse ox and blood pressure Approach: midline Location: L3-L4 Injection technique: LOR saline  Needle:  Needle type: Tuohy  Needle gauge: 17 G Needle length: 9 cm and 9 Needle insertion depth: 9 cm Catheter type: closed end flexible Catheter size: 19 Gauge Catheter at skin depth: 14 cm Test dose: negative  Assessment Events: blood not aspirated, injection not painful, no injection resistance, negative IV test and no paresthesia  Additional Notes Patient identified. Risks/Benefits/Options discussed with patient including but not limited to bleeding, infection, nerve damage, paralysis, failed block, incomplete pain control, headache, blood pressure changes, nausea, vomiting, reactions to medication both or allergic, itching and postpartum back pain. Confirmed with bedside nurse the patient's most recent platelet count. Confirmed with patient that they are not currently taking any anticoagulation, have any bleeding history or any family history of bleeding disorders. Patient expressed understanding and wished to proceed. All questions were answered. Sterile technique was used throughout the entire procedure. Please see nursing notes for vital signs. Test dose was given through epidural catheter and negative prior to continuing to dose epidural or start infusion. Warning signs of high block given to the patient including shortness of breath, tingling/numbness in hands, complete motor block, or any concerning symptoms with instructions to call for help. Patient was  given instructions on fall risk and not to get out of bed. All questions and concerns addressed with instructions to call with any issues or inadequate analgesia.      

## 2014-12-17 NOTE — Anesthesia Preprocedure Evaluation (Addendum)
Anesthesia Evaluation  Patient identified by MRN, date of birth, ID band Patient awake    Reviewed: Allergy & Precautions, NPO status , Patient's Chart, lab work & pertinent test results  History of Anesthesia Complications Negative for: history of anesthetic complications  Airway Mallampati: II  TM Distance: >3 FB Neck ROM: Full    Dental no notable dental hx.    Pulmonary neg pulmonary ROS,  breath sounds clear to auscultation  Pulmonary exam normal       Cardiovascular negative cardio ROS Normal cardiovascular examRhythm:Regular Rate:Normal     Neuro/Psych negative neurological ROS  negative psych ROS   GI/Hepatic Neg liver ROS, GERD-  Medicated and Controlled,  Endo/Other  negative endocrine ROS  Renal/GU negative Renal ROS  negative genitourinary   Musculoskeletal negative musculoskeletal ROS (+)   Abdominal   Peds negative pediatric ROS (+)  Hematology  (+) anemia ,   Anesthesia Other Findings   Reproductive/Obstetrics (+) Pregnancy                             Anesthesia Physical Anesthesia Plan  ASA: II  Anesthesia Plan: Epidural   Post-op Pain Management:    Induction:   Airway Management Planned:   Additional Equipment:   Intra-op Plan:   Post-operative Plan:   Informed Consent: I have reviewed the patients History and Physical, chart, labs and discussed the procedure including the risks, benefits and alternatives for the proposed anesthesia with the patient or authorized representative who has indicated his/her understanding and acceptance.   Dental advisory given  Plan Discussed with: CRNA  Anesthesia Plan Comments:         Anesthesia Quick Evaluation

## 2014-12-18 LAB — CBC
HCT: 28.9 % — ABNORMAL LOW (ref 36.0–46.0)
Hemoglobin: 8.9 g/dL — ABNORMAL LOW (ref 12.0–15.0)
MCH: 21.3 pg — ABNORMAL LOW (ref 26.0–34.0)
MCHC: 30.8 g/dL (ref 30.0–36.0)
MCV: 69.1 fL — AB (ref 78.0–100.0)
PLATELETS: 288 10*3/uL (ref 150–400)
RBC: 4.18 MIL/uL (ref 3.87–5.11)
RDW: 18.3 % — ABNORMAL HIGH (ref 11.5–15.5)
WBC: 14 10*3/uL — AB (ref 4.0–10.5)

## 2014-12-18 LAB — HIV ANTIBODY (ROUTINE TESTING W REFLEX): HIV SCREEN 4TH GENERATION: NONREACTIVE

## 2014-12-18 LAB — RPR: RPR: NONREACTIVE

## 2014-12-18 NOTE — Lactation Note (Signed)
This note was copied from the chart of Jaclyn Krystle Polcyn. Lactation Consultation Note  Initial visit made.  Breastfeeding consultation services and support information given to mom.  Baby is now 60 hours old and has had difficulty latching so mom is supplementing with formula.  Assisted with positioning baby in football hold.  Mom has large nipples and baby has a small mouth.  Attempted latch several times with no success.  Mom states she pumped and bottle fed her previous babies.  DEBP set up and instructed on use and cleaning.  Instructed to pump every 3 hours for 15 minutes.  Encouraged to continue to latch baby prior to pumping and giving a bottle.  Patient Name: Jaclyn Tyler [id number] Date: 12/18/2014 Reason for consult: Initial assessment;Difficult latch   Maternal Data    Feeding Feeding Type: Breast Fed  LATCH Score/Interventions Latch: Repeated attempts needed to sustain latch, nipple held in mouth throughout feeding, stimulation needed to elicit sucking reflex. Intervention(s): Skin to skin;Teach feeding cues;Waking techniques Intervention(s): Adjust position;Assist with latch;Breast massage;Breast compression  Audible Swallowing: None  Type of Nipple: Everted at rest and after stimulation  Comfort (Breast/Nipple): Soft / non-tender     Hold (Positioning): Assistance needed to correctly position infant at breast and maintain latch. Intervention(s): Breastfeeding basics reviewed;Support Pillows;Position options;Skin to skin  LATCH Score: 6  Lactation Tools Discussed/Used     Consult Status      Huston Foley 12/18/2014, 10:58 AM

## 2014-12-18 NOTE — Progress Notes (Signed)
Patient ID: Jaclyn Tyler, female   DOB: [birth year], 24 y.o.   MRN: [id number] Postpartum day one Vital signs normal Fundus firm Lochia moderate Legs negative doing well

## 2014-12-18 NOTE — Anesthesia Postprocedure Evaluation (Signed)
  Anesthesia Post-op Note  Patient: Jaclyn Tyler  Procedure(s) Performed: * No procedures listed *  Patient Location: Mother/Baby  Anesthesia Type:Epidural  Level of Consciousness: awake, alert  and oriented  Airway and Oxygen Therapy: Patient Spontanous Breathing  Post-op Pain: none  Post-op Assessment: Post-op Vital signs reviewed and Patient's Cardiovascular Status Stable              Post-op Vital Signs: Reviewed and stable  Last Vitals:  Filed Vitals:   12/18/14 0559  BP: 118/76  Pulse: 78  Temp: 36.9 C  Resp: 18    Complications: No apparent anesthesia complications

## 2014-12-18 NOTE — Progress Notes (Signed)
UR chart review completed.  

## 2014-12-19 NOTE — Lactation Note (Signed)
This note was copied from the chart of Jaclyn Tyler. Lactation Consultation Note  Patient Name: Jaclyn Tyler [id number] Date: 12/19/2014 Reason for consult: Follow-up assessment   With this mom and term baby, now 42 hours old, and weight 6 lbs 2 oz. I faxed information to [facility] for them to call her and let them know that she needs a DEP. If WIC does not have a pump for mom today, I did tel mom about our [facility] loaner DEP program. Mom is calling [facility] herself, to see if she can get a pump today. 'I spoke to mom about pumping at least 8 times a day, and to pump at least 15 minutes, and up to 30, until she stops dripping. Mom also knows to call lactation for questions/concerns, once discharged.    Maternal Data    Feeding    LATCH Score/Interventions                      Lactation Tools Discussed/Used WIC Program: Yes (fax sent - mom want to pup and bottle feed)   Consult Status Consult Status: Complete Follow-up type: Call as needed    Alfred Levins 12/19/2014, 11:27 AM

## 2014-12-19 NOTE — Discharge Instructions (Signed)
Discharge instructions   You can wash your hair  Shower  Eat what you want  Drink what you want  See me in 6 weeks  Your ankles are going to swell more in the next 2 weeks than when pregnant  No sex for 6 weeks   Antionetta Ator A, MD 12/19/2014

## 2014-12-19 NOTE — Clinical Social Work Maternal (Signed)
CLINICAL SOCIAL WORK MATERNAL/CHILD NOTE  Patient Details  Name: Jaclyn Tyler MRN: [id number] Date of Birth: April 07, 1991  Date:  12/19/2014  Clinical Social Worker Initiating Note:  Loleta Books, LCSW Date/ Time Initiated:  12/19/14/0945     Child's Name:  Ladona Ridgel   Legal Guardian:  Oscar La (mother) and Carlton Adam (father)   Need for Interpreter:  None   Date of Referral:  12/17/14     Reason for Referral:  History of anxiety  Referral Source:  [facility]   Address:  [address] [place], [place] [address]  Phone number:  [phone]   Household Members:  Minor Children, Significant Other   Natural Supports (not living in the home):  Immediate Family, Extended Family   Professional Supports:   None identified  Employment: Homemaker   Type of Work:   N/A  Education:    N/A  Architect:  [employer]   Other Resources:  [employer], Sales executive    Cultural/Religious Considerations Which May Impact Care:  None reported  Strengths:  Ability to meet basic needs , Home prepared for child , Pediatrician chosen    Risk Factors/Current Problems:   1)Mental Health Concerns: MOB presents with onset of occasional panic attacks during this pregnancy.  MOB stated that she experienced shortness of breath while in new/uncertain environments.    Cognitive State:  Able to Concentrate , Alert , Goal Oriented , Linear Thinking    Mood/Affect:  Calm , Comfortable , Relaxed    CSW Assessment:  CSW received consult due to MOB presenting with a history of anxiety.  MOB was noted to be in a quiet and reserved mood, but was pleasant and receptive to CSW visit.  She displayed an appropriate range of affect, and was observed to be interacting and bonding with the infant.  MOB did not present with any acute mental health symptoms.  MOB denied questions, concerns, or needs as she prepares to transition home.  MOB reported feeling "excited" and ready to go home.  Per  MOB, she experienced onset of panic attacks during the pregnancy.  She stated that they occurred while in "truck stops", and reported that she experienced shortness of breath.  MOB denied additional symptoms of anxiety, and did not indicate that her panic attacks are a current concern for her.  She stated that she spoke to her doctor about her symptoms, and she was informed that it was due to the pregnancy.  MOB did not indicate presence of additional stressors or thought processes that may have contributed to her anxiety.  MOB denied current anxiety as she prepares to transition home. She did report that it is difficult for her to allow the infant to sleep unless she is watching her out of fear that something will happen to the infant if she is not watching her. MOB recognizes that she needs to sleep, but did not identify her inability to let the infant sleep as a problem.  CSW continued to normalize normative anxieties with newborns, and reviewed education on perinatal mood and anxiety disorders.  MOB acknowledged that anxiety/panic attacks may continue postpartum, and agreed to contact her medical provider if she notes ongoing symptoms.    MOB denied additional questions, concerns, or needs at this time. She agreed to contact CSW if needs arise prior to discharge.   CSW Plan/Description:   1)Patient/Family Education: Perinatal mood and anxiety disorders 2)No Further Intervention Required/No Barriers to Discharge    Kelby Fam 12/19/2014, 11:27 AM

## 2014-12-19 NOTE — Progress Notes (Signed)
Patient ID: Jaclyn Tyler, female   DOB: [birth year], 24 y.o.   MRN: [id number] Postpartum day 2 Blood pressure [id number] respiration 18 pulse 88 afebrile Fundus firm Lochia moderate Legs negative doing well home today

## 2014-12-19 NOTE — Discharge Summary (Signed)
Obstetric Discharge Summary Reason for Admission: induction of labor Prenatal Procedures: none Intrapartum Procedures: spontaneous vaginal delivery Postpartum Procedures: none Complications-Operative and Postpartum: none HEMOGLOBIN  Date Value Ref Range Status  12/18/2014 8.9* 12.0 - 15.0 g/dL Final   HCT  Date Value Ref Range Status  12/18/2014 28.9* 36.0 - 46.0 % Final    Physical Exam:  General: alert Lochia: appropriate Uterine Fundus: firm Incision: healing well DVT Evaluation: No evidence of DVT seen on physical exam.  Discharge Diagnoses: Term Pregnancy-delivered  Discharge Information: Date: 12/19/2014 Activity: pelvic rest Diet: routine Medications: Percocet Condition: stable Instructions: refer to practice specific booklet Discharge to: home Follow-up Information    Follow up with Kathreen Cosier, MD.   Specialty:  Obstetrics and Gynecology   Contact information:   [address] [place] [address] [phone]       Newborn Data: Live born female  Birth Weight: 6 lb 5.2 oz (2870 g) APGAR: 8, 9  Home with mother.  Geanie Pacifico A 12/19/2014, 6:13 AM

## 2015-03-11 ENCOUNTER — Inpatient Hospital Stay (EMERGENCY_DEPARTMENT_HOSPITAL)
Admission: AD | Admit: 2015-03-11 | Discharge: 2015-03-11 | Disposition: A | Discharge status: Home / Self Care | Payer: Medicaid Other | Source: Ambulatory Visit | Attending: Obstetrics & Gynecology | Admitting: Obstetrics & Gynecology

## 2015-03-11 ENCOUNTER — Emergency Department (HOSPITAL_COMMUNITY): Payer: Medicaid Other

## 2015-03-11 ENCOUNTER — Emergency Department (HOSPITAL_COMMUNITY)
Admission: EM | Admit: 2015-03-11 | Discharge: 2015-03-11 | Disposition: A | Discharge status: Home / Self Care | Payer: Medicaid Other | Attending: Emergency Medicine | Admitting: Emergency Medicine

## 2015-03-11 ENCOUNTER — Encounter (HOSPITAL_COMMUNITY): Payer: Self-pay | Admitting: Emergency Medicine

## 2015-03-11 DIAGNOSIS — Z87828 Personal history of other (healed) physical injury and trauma: Secondary | ICD-10-CM | POA: Insufficient documentation

## 2015-03-11 DIAGNOSIS — Z8659 Personal history of other mental and behavioral disorders: Secondary | ICD-10-CM | POA: Diagnosis not present

## 2015-03-11 DIAGNOSIS — Z862 Personal history of diseases of the blood and blood-forming organs and certain disorders involving the immune mechanism: Secondary | ICD-10-CM | POA: Insufficient documentation

## 2015-03-11 DIAGNOSIS — Z8619 Personal history of other infectious and parasitic diseases: Secondary | ICD-10-CM | POA: Diagnosis not present

## 2015-03-11 DIAGNOSIS — M25561 Pain in right knee: Secondary | ICD-10-CM | POA: Diagnosis not present

## 2015-03-11 DIAGNOSIS — Z8719 Personal history of other diseases of the digestive system: Secondary | ICD-10-CM | POA: Insufficient documentation

## 2015-03-11 DIAGNOSIS — M25562 Pain in left knee: Secondary | ICD-10-CM

## 2015-03-11 MED ORDER — IBUPROFEN 800 MG PO TABS: 800.0000 mg | ORAL_TABLET | Freq: Three times a day (TID) | ORAL | Status: DC | PRN

## 2015-03-11 NOTE — ED Provider Notes (Signed)
CSN: [id number]     Arrival date & time 03/11/15  1006 History  By signing my name below, I, Jarvis Morgan, attest that this documentation has been prepared under the direction and in the presence of Charlestine Night, PA-C Electronically Signed: Jarvis Morgan, ED Scribe. 03/11/2015. 12:11 PM.    Chief Complaint  Patient presents with  . Knee Pain   The history is provided by the patient. No language interpreter was used.    HPI Comments: Jaclyn Tyler is a 24 y.o. female who presents to the Emergency Department complaining of intermittent, bilateral knee pain onset 1 year that has begun to gradually worsen over the past several days. She states 1 year ago she fell and landed on her left knee. Pt reports the pain is worse in the front of her knees. She notes the pain is exacerbated with ambulation and applied pressure. Pt denies any injury to the right knee but states she believes the pain is coming from overcompensating due to the pain in her left knee. She has not taken any medications prior to arrival. She denies any swelling, numbness or weakness in her knees.   Past Medical History  Diagnosis Date  . Gonorrhea   . Constipation   . Anemia   . Anxiety   . GERD (gastroesophageal reflux disease)    Past Surgical History  Procedure Laterality Date  . Wisdom tooth extraction     History reviewed. No pertinent family history. Social History  Substance Use Topics  . Smoking status: Never Smoker   . Smokeless tobacco: Never Used  . Alcohol Use: Yes     Comment: occasion  LAST TIME-  04-02-2014   OB History    Gravida Para Term Preterm AB TAB SAB Ectopic Multiple Living   0 2 0 2 0 0 3     Review of Systems 10 Systems reviewed and all are negative for acute change except as noted in the HPI.     Allergies  Other  Home Medications   Prior to Admission medications   Not on File   Triage Vitals: BP 122/73 mmHg  Pulse 89  Temp(Src) 98.1 F (36.7 C) (Oral)   Resp 18  SpO2 99%  LMP 02/13/2015  Physical Exam  Constitutional: She is oriented to person, place, and time. She appears well-developed and well-nourished. No distress.  HENT:  Head: Normocephalic and atraumatic.  Eyes: Conjunctivae and EOM are normal.  Neck: Neck supple. No tracheal deviation present.  Cardiovascular: Normal rate.   Pulmonary/Chest: Effort normal. No respiratory distress.  Musculoskeletal: Normal range of motion. She exhibits tenderness.  Bilateral anterior knee pain on palpation, pain with ROM Posterior left knee pain, diffuse No swelling noted  Neurological: She is alert and oriented to person, place, and time.  Skin: Skin is warm and dry.  Psychiatric: She has a normal mood and affect. Her behavior is normal.  Nursing note and vitals reviewed.   ED Course  Procedures (including critical care time)  DIAGNOSTIC STUDIES: Oxygen Saturation is 99% on RA, normal by my interpretation.    COORDINATION OF CARE: 10:35 AM-Will order imaging of right knee and left knee. Pt advised of plan for treatment and pt agrees.    Labs Review Labs Reviewed - No data to display  Imaging Review Dg Knee Complete 4 Views Left  03/11/2015  CLINICAL DATA:  Left knee pain starting 1 year ago after a fall, chronic. EXAM: LEFT KNEE - COMPLETE 4+ VIEW COMPARISON:  02/06/2012 FINDINGS: Equivocal medial compartmental narrowing. No appreciable knee effusion or significant spurring. No significant bony abnormality observed. IMPRESSION: 1. Equivocal medial compartmental narrowing, potentially degenerative. Otherwise, no significant abnormalities are observed. Electronically Signed   By: Gaylyn RongWalter  Liebkemann M.D.   On: 03/11/2015 11:58   Dg Knee Complete 4 Views Right  03/11/2015  CLINICAL DATA:  Bilateral knee pain anteriorly, on the right side for the last day. EXAM: RIGHT KNEE - COMPLETE 4+ VIEW COMPARISON:  None. FINDINGS: Select articular space narrowing in the medial compartment. No  knee effusion, but there is some indistinctness along the distal patellar tendon raising the possibility of low-level edema/ stranding in the adjacent adipose tissue. IMPRESSION: 1. Equivocal medial compartmental narrowing. Degenerative chondral narrowing would be unusual in this age group. If pain persists despite conservative therapy, MRI may be warranted for further characterization. 2. Potential low-level stranding/edema around the distal patellar tendon. No underlying bony abnormality or knee effusion. Electronically Signed   By: Gaylyn RongWalter  Liebkemann M.D.   On: 03/11/2015 11:57   I have personally reviewed and evaluated these images as part of my medical decision-making.  I personally performed the services described in this documentation, which was scribed in my presence. The recorded information has been reviewed and is accurate.   We will give follow-up with orthopedics.  Told to return here as needed.  Ice and elevate her knees.  Knee immobilizer placed on the left knee   Charlestine NightChristopher Malan Werk, PA-C 03/11/15 1227  Arby BarretteMarcy Pfeiffer, MD 03/12/15 [phone]

## 2015-03-11 NOTE — MAU Note (Signed)
Pt states she has fluid on her L knee, it is swollen, now also has knot on her right knee.  Both knees are painful.

## 2015-03-11 NOTE — MAU Provider Note (Signed)
S: HPI: Oscar Laenny Striplin is a 24 y.o. year old [id number] female who presents to MAU reporting left knee pain and swelling and a knot on her right knee. Denies fever, chills or concern for emergent condition.   O:BP 129/71 mmHg  Pulse 78  Temp(Src) 98.2 F (36.8 C) (Oral)  Resp 18 Mild distress w/ ambulation, but able to ambulate  A: Knee pain  MSE performed. No evidence of emergent condition  P: Pt instructed to F/U w/ PCP, ortho or Urgent Care  Dorathy KinsmanVirginia Jovaun Levene, CNM 03/11/2015 9:51 AM

## 2015-03-11 NOTE — Discharge Instructions (Signed)
Return here as needed.  Follow-up with the orthopedist °

## 2015-03-11 NOTE — ED Notes (Signed)
Pt c/o bilateral knee pain x 1 year, since slipped and fell.

## 2015-03-11 NOTE — Discharge Instructions (Signed)

## 2015-03-11 NOTE — ED Notes (Signed)
Pt sts bilateral knee pain with left side x 1 year since fall and right more recent

## 2015-11-22 ENCOUNTER — Encounter (HOSPITAL_COMMUNITY): Payer: Self-pay | Admitting: Emergency Medicine

## 2015-11-22 ENCOUNTER — Emergency Department (HOSPITAL_COMMUNITY)
Admission: EM | Admit: 2015-11-22 | Discharge: 2015-11-23 | Disposition: A | Discharge status: Home / Self Care | Payer: Medicaid Other | Attending: Emergency Medicine | Admitting: Emergency Medicine

## 2015-11-22 DIAGNOSIS — R591 Generalized enlarged lymph nodes: Secondary | ICD-10-CM

## 2015-11-22 DIAGNOSIS — R42 Dizziness and giddiness: Secondary | ICD-10-CM | POA: Diagnosis not present

## 2015-11-22 DIAGNOSIS — R220 Localized swelling, mass and lump, head: Secondary | ICD-10-CM | POA: Diagnosis present

## 2015-11-22 NOTE — ED Triage Notes (Signed)
Pt. Reports lump beneath skin at posterior upper neck onset 3 days ago with mild intermittent pain , denies injury / no drainage .

## 2015-11-23 NOTE — ED Provider Notes (Signed)
[facility]-EMERGENCY DEPT Provider Note   CSN: [id number] Arrival date & time: 11/22/15  2303  First Provider Contact:  None       History   Chief Complaint Chief Complaint  Patient presents with  . Mass    HPI Jaclyn Tyler is a 25 y.o. female.  HPI   Patient is a 25 year old female with a history of anxiety, GERD who presents to the emergency department with complaints of a lump on the right side of the back of her head or 2 days. She states this morning the bump became painful with touch. Pain is sharp, 7/10. Patient states she had a perm roughly 2 weeks ago and has had sores on her head for the last week. She also complaining of intermittent blurred vision, dizziness and nausea since 1 hour PTA. She denies vomiting, fever, chills, dull pain, recent illness, headache, sinus condition, rhinitis, sore throat, chest pain, short of breath, numbness/tingling, weakness.  Past Medical History:  Diagnosis Date  . Anemia   . Anxiety   . Constipation   . GERD (gastroesophageal reflux disease)   . Gonorrhea     Patient Active Problem List   Diagnosis Date Noted  . Pregnancy 12/17/2014  . NVD (normal vaginal delivery) 12/17/2014  . [redacted] weeks gestation of pregnancy   . Abdominal pain in pregnancy, antepartum     Past Surgical History:  Procedure Laterality Date  . WISDOM TOOTH EXTRACTION      OB History    Gravida Para Term Preterm AB Living   0 2 3   SAB TAB Ectopic Multiple Live Births   2 0 0 0         Home Medications    Prior to Admission medications   Medication Sig Start Date End Date Taking? Authorizing Provider  ibuprofen (ADVIL,MOTRIN) 800 MG tablet Take 1 tablet (800 mg total) by mouth every 8 (eight) hours as needed. 03/11/15   Charlestine Night, PA-C    Family History No family history on file.  Social History Social History  Substance Use Topics  . Smoking status: Never Smoker  . Smokeless tobacco: Never Used  . Alcohol use Yes   Comment: occasion  LAST TIME-  04-02-2014     Allergies   Other   Review of Systems Review of Systems  All other systems negative except as documented in the history of present illness. All pertinent positives and negatives as reviewed in the history of present illness.  Physical Exam Updated Vital Signs BP 125/73 (BP Location: Left Arm)   Pulse 93   Temp 99.4 F (37.4 C) (Oral)   Resp 20   LMP 11/08/2015 (Approximate)   SpO2 100%   Physical Exam  Constitutional: Pt is oriented to person, place, and time. Pt appears well-developed and well-nourished. No distress.  HENT:  Head: Normocephalic and atraumatic, mobile, nonfluctuant yet soft mass noted to right posterior scalp just below occipital bone, mild tender to palpation, no erythema, edema or increased warmth. Scattered wound on scalp with overlying scabs with out signs of surrounding infection. Mouth/Throat: Oropharynx is clear and moist.  Eyes: Conjunctivae and EOM are normal. Pupils are equal, round, and reactive to light. No scleral icterus.  No horizontal, vertical or rotational nystagmus  Neck: Normal range of motion. Neck supple.  Full active and passive ROM without pain No nuchal rigidity or meningeal signs  Cardiovascular: Normal rate, regular rhythm and intact distal pulses including radial and DP 2+ bilaterally.   Pulmonary/Chest:  Effort normal  No respiratory distress.  Abdominal: Soft. There is no tenderness. There is no rebound and no guarding.  Musculoskeletal: Normal range of motion.  Neurological: Pt. is alert and oriented to person, place, and time. No cranial nerve deficit.  Exhibits normal muscle tone. Coordination normal.  Mental Status:  Alert, oriented, thought content appropriate. Speech fluent without evidence of aphasia. Able to follow 2 step commands without difficulty.  Cranial Nerves:  II:  Peripheral visual fields grossly normal, pupils equal, round, reactive to light III,IV, VI: ptosis not  present, extra-ocular motions intact bilaterally  V,VII: smile symmetric, facial light touch sensation equal VIII: hearing grossly normal bilaterally  IX,X: midline uvula rise  XI: bilateral shoulder shrug equal and strong XII: midline tongue extension  Motor:  5/5 in upper and lower extremities bilaterally including strong and equal grip strength and dorsiflexion/plantar flexion Sensory: light touch normal in all extremities.  Cerebellar: normal finger-to-nose with bilateral upper extremities, pronator drift negative Gait: normal gait and balance Skin: Skin is warm and dry. No rash noted. Pt is not diaphoretic.  Psychiatric: Pt has a normal mood and affect. Behavior is normal. Judgment and thought content normal.  Nursing note and vitals reviewed.   ED Treatments / Results  Labs (all labs ordered are listed, but only abnormal results are displayed) Labs Reviewed - No data to display  EKG  EKG Interpretation None       Radiology No results found.  Procedures Procedures (including critical care time)  Medications Ordered in ED Medications - No data to display   Initial Impression / Assessment and Plan / ED Course  I have reviewed the triage vital signs and the nursing notes.  Pertinent labs & imaging results that were available during my care of the patient were reviewed by me and considered in my medical decision making (see chart for details).  Clinical Course   Patient with a bump on the back of her head. This lump is likely lymphadenopathy 2/2 scattered lesions to her scalp after getting a perm. Pt afebrile, VSS, no systemic symptoms no signs of surrounding cellulitis or area of fluctuance that is drainable. Instructed pt to f/u with PCP within 2 days to have reevaluated.  Dizziness could be 2/2 a mild case of benign positional vertigo. Pt symptoms were reproduced with rising from supine position. No systemic symptoms, VSS, no neurological deficits on exam. Less  concerning for central vertigo. Pt refused Antivert to see if her symptoms would resolve.   I instructed pt to f/u with her PCP or the [facility] center in 2 days if symptoms do not improve and to have the lesion on the back of her head rechecked. Discussed strict return precautions.   Pt expressed understanding to the discharge instructions.   Final Clinical Impressions(s) / ED Diagnoses   Final diagnoses:  Lymphadenopathy  Dizziness    New Prescriptions Discharge Medication List as of 11/23/2015  1:23 AM       Jerre Simon, PA 11/26/15 1621    Dione Booze, MD 11/28/15 1754

## 2015-11-23 NOTE — Discharge Instructions (Signed)
Follow-up at the [facility] on Monday to have the bump on your head injury dizziness reevaluated. Return to the emergency department if any her symptoms worsen or you experience headache, you pass out, vomiting, numbness/timing, weakness, fever or any other concerning symptoms.

## 2015-12-16 ENCOUNTER — Encounter (HOSPITAL_COMMUNITY): Payer: Self-pay

## 2015-12-16 ENCOUNTER — Inpatient Hospital Stay (HOSPITAL_COMMUNITY)
Admission: AD | Admit: 2015-12-16 | Discharge: 2015-12-16 | Disposition: A | Discharge status: Home / Self Care | Payer: Medicaid Other | Source: Ambulatory Visit | Attending: Family Medicine | Admitting: Family Medicine

## 2015-12-16 DIAGNOSIS — A5901 Trichomonal vulvovaginitis: Secondary | ICD-10-CM

## 2015-12-16 DIAGNOSIS — Z202 Contact with and (suspected) exposure to infections with a predominantly sexual mode of transmission: Secondary | ICD-10-CM

## 2015-12-16 DIAGNOSIS — N898 Other specified noninflammatory disorders of vagina: Secondary | ICD-10-CM | POA: Diagnosis present

## 2015-12-16 LAB — WET PREP, GENITAL
Sperm: NONE SEEN
YEAST WET PREP: NONE SEEN

## 2015-12-16 LAB — URINALYSIS, ROUTINE W REFLEX MICROSCOPIC
Bilirubin Urine: NEGATIVE
GLUCOSE, UA: NEGATIVE mg/dL
KETONES UR: NEGATIVE mg/dL
NITRITE: NEGATIVE
PROTEIN: NEGATIVE mg/dL
Specific Gravity, Urine: 1.025 (ref 1.005–1.030)
pH: 6 (ref 5.0–8.0)

## 2015-12-16 LAB — POCT PREGNANCY, URINE: Preg Test, Ur: NEGATIVE

## 2015-12-16 LAB — CBC
HCT: 34 % — ABNORMAL LOW (ref 36.0–46.0)
Hemoglobin: 11 g/dL — ABNORMAL LOW (ref 12.0–15.0)
MCH: 26.3 pg (ref 26.0–34.0)
MCHC: 32.4 g/dL (ref 30.0–36.0)
MCV: 81.3 fL (ref 78.0–100.0)
PLATELETS: 280 10*3/uL (ref 150–400)
RBC: 4.18 MIL/uL (ref 3.87–5.11)
RDW: 15.6 % — AB (ref 11.5–15.5)
WBC: 6.4 10*3/uL (ref 4.0–10.5)

## 2015-12-16 LAB — URINE MICROSCOPIC-ADD ON

## 2015-12-16 LAB — RAPID STREP SCREEN (MED CTR MEBANE ONLY): Streptococcus, Group A Screen (Direct): NEGATIVE

## 2015-12-16 MED ORDER — CEFTRIAXONE SODIUM 250 MG IJ SOLR
250.0000 mg | Freq: Once | INTRAMUSCULAR | Status: AC
  Administered 2015-12-16: 250 mg via INTRAMUSCULAR
  Filled 2015-12-16: qty 250

## 2015-12-16 MED ORDER — METRONIDAZOLE 500 MG PO TABS
2000.0000 mg | ORAL_TABLET | Freq: Once | ORAL | Status: AC
  Administered 2015-12-16: 2000 mg via ORAL
  Filled 2015-12-16: qty 4

## 2015-12-16 MED ORDER — AZITHROMYCIN 250 MG PO TABS
1000.0000 mg | ORAL_TABLET | Freq: Once | ORAL | Status: AC
  Administered 2015-12-16: 1000 mg via ORAL
  Filled 2015-12-16: qty 4

## 2015-12-16 NOTE — MAU Provider Note (Signed)
Chief Complaint: vag burning and Vaginal Discharge   None     SUBJECTIVE HPI: Jaclyn Tyler is a 25 y.o. [phone] who presents to maternity admissions reporting vaginal burning, thick white discharge and dysuria x 3 days.  She reports a sore throat and white patches on her throat with no URI symptoms or cough.  She also reports her s/o called her from his [employer] and said he had recently received a shot of medication for an STD.  She wants complete STD testing today.  She has not tried any treatments for her symptoms and nothing makes them better or worse. She denies vaginal bleeding, h/a, dizziness, n/v, or fever/chills.     HPI  Past Medical History:  Diagnosis Date  . Anemia   . Anxiety   . Constipation   . GERD (gastroesophageal reflux disease)   . Gonorrhea    Past Surgical History:  Procedure Laterality Date  . WISDOM TOOTH EXTRACTION     Social History   Social History  . Marital status: Single    Spouse name: N/A  . Number of children: N/A  . Years of education: N/A   Occupational History  . Not on file.   Social History Main Topics  . Smoking status: Never Smoker  . Smokeless tobacco: Never Used  . Alcohol use Yes     Comment: occasion  LAST TIME-  04-02-2014  . Drug use: No  . Sexual activity: Yes    Birth control/ protection: None     Comment: Not since tx for gonorrhea   Other Topics Concern  . Not on file   Social History Narrative  . No narrative on file   No current facility-administered medications on file prior to encounter.    No current outpatient prescriptions on file prior to encounter.   Allergies  Allergen Reactions  . Mushroom Extract Complex Hives  . Other Hives    Pt is allergic to paprika seasoning    ROS:  Review of Systems  Constitutional: Negative for chills, fatigue and fever.  Respiratory: Negative for shortness of breath.   Cardiovascular: Negative for chest pain.  Genitourinary: Positive for vaginal discharge and  vaginal pain. Negative for difficulty urinating, dysuria, flank pain, pelvic pain and vaginal bleeding.  Neurological: Negative for dizziness and headaches.  Psychiatric/Behavioral: Negative.      I have reviewed patient's Past Medical Hx, Surgical Hx, Family Hx, Social Hx, medications and allergies.   Physical Exam   Patient Vitals for the past 24 hrs:  BP Temp Temp src Pulse Resp  12/16/15 1406 125/80 98.2 F (36.8 C) Oral 73 18   Constitutional: Well-developed, well-nourished female in no acute distress.  Cardiovascular: normal rate Respiratory: normal effort GI: Abd soft, non-tender. Pos BS x 4 MS: Extremities nontender, no edema, normal ROM Neurologic: Alert and oriented x 4.  GU: Neg CVAT.  PELVIC EXAM: Wet prep/GC collected by blind swab  LAB RESULTS Results for orders placed or performed during the hospital encounter of 12/16/15 (from the past 24 hour(s))  Urinalysis, Routine w reflex microscopic (not at [facility])     Status: Abnormal   Collection Time: 12/16/15  2:07 PM  Result Value Ref Range   Color, Urine YELLOW YELLOW   APPearance HAZY (A) CLEAR   Specific Gravity, Urine 1.025 1.005 - 1.030   pH 6.0 5.0 - 8.0   Glucose, UA NEGATIVE NEGATIVE mg/dL   Hgb urine dipstick SMALL (A) NEGATIVE   Bilirubin Urine NEGATIVE NEGATIVE  Ketones, ur NEGATIVE NEGATIVE mg/dL   Protein, ur NEGATIVE NEGATIVE mg/dL   Nitrite NEGATIVE NEGATIVE   Leukocytes, UA LARGE (A) NEGATIVE  Urine microscopic-add on     Status: Abnormal   Collection Time: 12/16/15  2:07 PM  Result Value Ref Range   Squamous Epithelial / LPF 6-30 (A) NONE SEEN   WBC, UA 6-30 0 - 5 WBC/hpf   RBC / HPF 0-5 0 - 5 RBC/hpf   Bacteria, UA FEW (A) NONE SEEN   Trichomonas, UA PRESENT    Urine-Other YEAST PRESENT   Pregnancy, urine POC     Status: None   Collection Time: 12/16/15  2:15 PM  Result Value Ref Range   Preg Test, Ur NEGATIVE NEGATIVE  CBC     Status: Abnormal   Collection Time: 12/16/15  6:38 PM   Result Value Ref Range   WBC 6.4 4.0 - 10.5 K/uL   RBC 4.18 3.87 - 5.11 MIL/uL   Hemoglobin 11.0 (L) 12.0 - 15.0 g/dL   HCT 40.934.0 (L) 81.136.0 - 91.446.0 %   MCV 81.3 78.0 - 100.0 fL   MCH 26.3 26.0 - 34.0 pg   MCHC 32.4 30.0 - 36.0 g/dL   RDW 78.215.6 (H) 95.611.5 - 21.315.5 %   Platelets 280 150 - 400 K/uL  Wet prep, genital     Status: Abnormal   Collection Time: 12/16/15  6:45 PM  Result Value Ref Range   Yeast Wet Prep HPF POC NONE SEEN NONE SEEN   Trich, Wet Prep PRESENT (A) NONE SEEN   Clue Cells Wet Prep HPF POC PRESENT (A) NONE SEEN   WBC, Wet Prep HPF POC MANY (A) NONE SEEN   Sperm NONE SEEN   Rapid strep screen (not at [facility])     Status: None   Collection Time: 12/16/15  6:45 PM  Result Value Ref Range   Streptococcus, Group A Screen (Direct) NEGATIVE NEGATIVE    --/--/A POS (08/23 [id number])  IMAGING No results found.  MAU Management/MDM: Ordered labs and reviewed results.  Pt positive for trichomonas and known exposure to other STD, likely gonorrhea.  Offered prophylactic treatment for gonorrhea/chlamydia and treatment for positive trichomonas and pt agreed with plan.  Declined partner therapy.  Rocephin 250 mg IM, azithromycin 1000 mg x 1, and Flagyl 2 g PO x 1.  Pt stable at time of discharge.  ASSESSMENT 1. Exposure to sexually transmitted disease (STD)   2. Trichomonal vaginitis     PLAN Discharge home   Medication List    TAKE these medications   cyanocobalamin 1000 MCG tablet Take 1,000 mcg by mouth daily.      Follow-up Information    OB/Gyn provider of your choice. Schedule an appointment as soon as possible for a visit today.   Why:  As needed          Sharen CounterLisa Leftwich-Kirby Certified Nurse-Midwife 12/16/2015  8:27 PM

## 2015-12-16 NOTE — Discharge Instructions (Signed)
Trichomoniasis °Trichomoniasis is an infection caused by an organism called Trichomonas. The infection can affect both women and men. In women, the outer female genitalia and the vagina are affected. In men, the penis is mainly affected, but the prostate and other reproductive organs can also be involved. Trichomoniasis is a sexually transmitted infection (STI) and is most often passed to another person through sexual contact.  °RISK FACTORS °· Having unprotected sexual intercourse. °· Having sexual intercourse with an infected partner. °SIGNS AND SYMPTOMS  °Symptoms of trichomoniasis in women include: °· Abnormal gray-green frothy vaginal discharge. °· Itching and irritation of the vagina. °· Itching and irritation of the area outside the vagina. °Symptoms of trichomoniasis in men include:  °· Penile discharge with or without pain. °· Pain during urination. This results from inflammation of the urethra. °DIAGNOSIS  °Trichomoniasis may be found during a Pap test or physical exam. Your health care provider may use one of the following methods to help diagnose this infection: °· Testing the pH of the vagina with a test tape. °· Using a vaginal swab test that checks for the Trichomonas organism. A test is available that provides results within a few minutes. °· Examining a urine sample. °· Testing vaginal secretions. °Your health care provider may test you for other STIs, including HIV. °TREATMENT  °· You may be given medicine to fight the infection. Women should inform their health care provider if they could be or are pregnant. Some medicines used to treat the infection should not be taken during pregnancy. °· Your health care provider may recommend over-the-counter medicines or creams to decrease itching or irritation. °· Your sexual partner will need to be treated if infected. °· Your health care provider may test you for infection again 3 months after treatment. °HOME CARE INSTRUCTIONS  °· Take medicines only as  directed by your health care provider. °· Take over-the-counter medicine for itching or irritation as directed by your health care provider. °· Do not have sexual intercourse while you have the infection. °· Women should not douche or wear tampons while they have the infection. °· Discuss your infection with your partner. Your partner may have gotten the infection from you, or you may have gotten it from your partner. °· Have your sex partner get examined and treated if necessary. °· Practice safe, informed, and protected sex. °· See your health care provider for other STI testing. °SEEK MEDICAL CARE IF:  °· You still have symptoms after you finish your medicine. °· You develop abdominal pain. °· You have pain when you urinate. °· You have bleeding after sexual intercourse. °· You develop a rash. °· Your medicine makes you sick or makes you throw up (vomit). °MAKE SURE YOU: °· Understand these instructions. °· Will watch your condition. °· Will get help right away if you are not doing well or get worse. °  °This information is not intended to replace advice given to you by your health care provider. Make sure you discuss any questions you have with your health care provider. °  °Document Released: 10/06/2000 Document Revised: 05/03/2014 Document Reviewed: 01/22/2013 °Elsevier Interactive Patient Education ©2016 [employer] ° °Sexually Transmitted Disease °A sexually transmitted disease (STD) is a disease or infection that may be passed (transmitted) from person to person, usually during sexual activity. This may happen by way of saliva, semen, blood, vaginal mucus, or urine. Common STDs include: °· Gonorrhea. °· Chlamydia. °· Syphilis. °· HIV and AIDS. °· Genital herpes. °· Hepatitis B and   C. °· Trichomonas. °· Human papillomavirus (HPV). °· Pubic lice. °· Scabies. °· Mites. °· Bacterial vaginosis. °WHAT ARE CAUSES OF STDs? °An STD may be caused by bacteria, a virus, or parasites. STDs are often transmitted during  sexual activity if one person is infected. However, they may also be transmitted through nonsexual means. STDs may be transmitted after:  °· Sexual intercourse with an infected person. °· Sharing sex toys with an infected person. °· Sharing needles with an infected person or using unclean piercing or tattoo needles. °· Having intimate contact with the genitals, mouth, or rectal areas of an infected person. °· Exposure to infected fluids during birth. °WHAT ARE THE SIGNS AND SYMPTOMS OF STDs? °Different STDs have different symptoms. Some people may not have any symptoms. If symptoms are present, they may include: °· Painful or bloody urination. °· Pain in the pelvis, abdomen, vagina, anus, throat, or eyes. °· A skin rash, itching, or irritation. °· Growths, ulcerations, blisters, or sores in the genital and anal areas. °· Abnormal vaginal discharge with or without bad odor. °· Penile discharge in men. °· Fever. °· Pain or bleeding during sexual intercourse. °· Swollen glands in the groin area. °· Yellow skin and eyes (jaundice). This is seen with hepatitis. °· Swollen testicles. °· Infertility. °· Sores and blisters in the mouth. °HOW ARE STDs DIAGNOSED? °To make a diagnosis, your health care provider may: °· Take a medical history. °· Perform a physical exam. °· Take a sample of any discharge to examine. °· Swab the throat, cervix, opening to the penis, rectum, or vagina for testing. °· Test a sample of your first morning urine. °· Perform blood tests. °· Perform a Pap test, if this applies. °· Perform a colposcopy. °· Perform a laparoscopy. °HOW ARE STDs TREATED? °Treatment depends on the STD. Some STDs may be treated but not cured. °· Chlamydia, gonorrhea, trichomonas, and syphilis can be cured with antibiotic medicine. °· Genital herpes, hepatitis, and HIV can be treated, but not cured, with prescribed medicines. The medicines lessen symptoms. °· Genital warts from HPV can be treated with medicine or by freezing,  burning (electrocautery), or surgery. Warts may come back. °· HPV cannot be cured with medicine or surgery. However, abnormal areas may be removed from the cervix, vagina, or vulva. °· If your diagnosis is confirmed, your recent sexual partners need treatment. This is true even if they are symptom-free or have a negative culture or evaluation. They should not have sex until their health care providers say it is okay. °· Your health care provider may test you for infection again 3 months after treatment. °HOW CAN I REDUCE MY RISK OF GETTING AN STD? °Take these steps to reduce your risk of getting an STD: °· Use latex condoms, dental dams, and water-soluble lubricants during sexual activity. Do not use petroleum jelly or oils. °· Avoid having multiple sex partners. °· Do not have sex with someone who has other sex partners °· Do not have sex with anyone you do not know or who is at high risk for an STD. °· Avoid risky sex practices that can break your skin. °· Do not have sex if you have open sores on your mouth or skin. °· Avoid drinking too much alcohol or taking illegal drugs. Alcohol and drugs can affect your judgment and put you in a vulnerable position. °· Avoid engaging in oral and anal sex acts. °· Get vaccinated for HPV and hepatitis. If you have not received these vaccines in   the past, talk to your health care provider about whether one or both might be right for you. °· If you are at risk of being infected with HIV, it is recommended that you take a prescription medicine daily to prevent HIV infection. This is called pre-exposure prophylaxis (PrEP). You are considered at risk if: °¨ You are a man who has sex with other men (MSM). °¨ You are a heterosexual man or woman and are sexually active with more than one partner. °¨ You take drugs by injection. °¨ You are sexually active with a partner who has HIV. °· Talk with your health care provider about whether you are at high risk of being infected with HIV. If  you choose to begin PrEP, you should first be tested for HIV. You should then be tested every 3 months for as long as you are taking PrEP. °WHAT SHOULD I DO IF I THINK I HAVE AN STD? °· See your health care provider. °· Tell your sexual partner(s). They should be tested and treated for any STDs. °· Do not have sex until your health care provider says it is okay. °WHEN SHOULD I GET IMMEDIATE MEDICAL CARE? °Contact your health care provider right away if:  °· You have severe abdominal pain. °· You are a man and notice swelling or pain in your testicles. °· You are a woman and notice swelling or pain in your vagina. °  °This information is not intended to replace advice given to you by your health care provider. Make sure you discuss any questions you have with your health care provider. °  °Document Released: 07/03/2002 Document Revised: 05/03/2014 Document Reviewed: 10/31/2012 °Elsevier Interactive Patient Education ©2016 Elsevier Inc. ° °

## 2015-12-16 NOTE — MAU Note (Signed)
Vaginal burning, started a wk ago.  Noted a d/c today, white, thick- chunky. No odor.  Thought she had a UTI because she didn't finish the meds last time.  White patches on throat, didn't have voice this morning.

## 2015-12-16 NOTE — MAU Provider Note (Signed)
Student note  History    CSN: [id number]  Arrival date and time: 12/16/15 1333    Chief Complaint  Patient presents with  . vag burning  . Vaginal Discharge   HPI Jaclyn Tyler is a 24yo [id number] African American who presents today complaining of vaginal burning and discharge and mild 'crampy' abdominal pain.  She is requesting STD testing at this visit.  These symptoms began 1 week ago.  The patient describes the discharge as a clear mucus.  She has not noticed any odor.  She denies significant vaginal bleeding but does admit some blood with wiping after urination.   The patient requested testing after receiving a call from her boyfriend that he had tested positive for an STD.    The patient also has a sore throat that began 1 week ago and 'lost her voice' this morning.    OB History    Gravida Para Term Preterm AB Living   5 3 3  0 2 3   SAB TAB Ectopic Multiple Live Births   2 0 0 0 3      Past Medical History:  Diagnosis Date  . Anemia   . Anxiety   . Constipation   . GERD (gastroesophageal reflux disease)   . Gonorrhea     Past Surgical History:  Procedure Laterality Date  . WISDOM TOOTH EXTRACTION      No family history on file.  Social History  Substance Use Topics  . Smoking status: Never Smoker  . Smokeless tobacco: Never Used  . Alcohol use Yes     Comment: occasion  LAST TIME-  04-02-2014    Allergies:  Allergies  Allergen Reactions  . Mushroom Extract Complex Hives  . Other Hives    Pt is allergic to paprika seasoning    Prescriptions Prior to Admission  Medication Sig Dispense Refill Last Dose  . cyanocobalamin 1000 MCG tablet Take 1,000 mcg by mouth daily.   Past Month at Unknown time    Review of Systems  Constitutional: Negative for chills and fever.  HENT: Positive for sore throat.   Respiratory: Negative for cough, shortness of breath and wheezing.   Cardiovascular: Negative for chest pain and palpitations.  Gastrointestinal:  Positive for nausea. Negative for constipation, diarrhea and vomiting.  Genitourinary: Positive for dysuria. Negative for urgency.   Physical Exam   Blood pressure 125/80, pulse 73, temperature 98.2 F (36.8 C), temperature source Oral, resp. rate 18, last menstrual period 12/09/2015, unknown if currently breastfeeding.  Physical Exam  Constitutional: She is oriented to person, place, and time. She appears well-developed and well-nourished. She appears distressed.  HENT:  Head: Atraumatic.  Mouth/Throat: Oropharyngeal exudate present.  White plaques noted on bilateral tonsillar pillars  Eyes: EOM are normal.  Neck: No thyromegaly present.  Cardiovascular: Normal rate, regular rhythm and normal heart sounds.  Exam reveals no gallop and no friction rub.   No murmur heard. Respiratory: Effort normal and breath sounds normal. No respiratory distress. She has no wheezes. She has no rales.  GI: Soft. Bowel sounds are normal. She exhibits no distension. There is tenderness.  Mild abdominal tenderness with deep palpation on right side.  Genitourinary: Vaginal discharge found.  Neurological: She is alert and oriented to person, place, and time.  Skin: Skin is warm and dry. No rash noted. She is not diaphoretic. No erythema.  Psychiatric: She has a normal mood and affect. Her behavior is normal.   Results for orders placed or performed during  the hospital encounter of 12/16/15 (from the past 24 hour(s))  Urinalysis, Routine w reflex microscopic (not at [facility])     Status: Abnormal   Collection Time: 12/16/15  2:07 PM  Result Value Ref Range   Color, Urine YELLOW YELLOW   APPearance HAZY (A) CLEAR   Specific Gravity, Urine 1.025 1.005 - 1.030   pH 6.0 5.0 - 8.0   Glucose, UA NEGATIVE NEGATIVE mg/dL   Hgb urine dipstick SMALL (A) NEGATIVE   Bilirubin Urine NEGATIVE NEGATIVE   Ketones, ur NEGATIVE NEGATIVE mg/dL   Protein, ur NEGATIVE NEGATIVE mg/dL   Nitrite NEGATIVE NEGATIVE   Leukocytes,  UA LARGE (A) NEGATIVE  Urine microscopic-add on     Status: Abnormal   Collection Time: 12/16/15  2:07 PM  Result Value Ref Range   Squamous Epithelial / LPF 6-30 (A) NONE SEEN   WBC, UA 6-30 0 - 5 WBC/hpf   RBC / HPF 0-5 0 - 5 RBC/hpf   Bacteria, UA FEW (A) NONE SEEN   Trichomonas, UA PRESENT    Urine-Other YEAST PRESENT   Pregnancy, urine POC     Status: None   Collection Time: 12/16/15  2:15 PM  Result Value Ref Range   Preg Test, Ur NEGATIVE NEGATIVE  CBC     Status: Abnormal   Collection Time: 12/16/15  6:38 PM  Result Value Ref Range   WBC 6.4 4.0 - 10.5 K/uL   RBC 4.18 3.87 - 5.11 MIL/uL   Hemoglobin 11.0 (L) 12.0 - 15.0 g/dL   HCT 16.134.0 (L) 09.636.0 - 04.546.0 %   MCV 81.3 78.0 - 100.0 fL   MCH 26.3 26.0 - 34.0 pg   MCHC 32.4 30.0 - 36.0 g/dL   RDW 40.915.6 (H) 81.111.5 - 91.415.5 %   Platelets 280 150 - 400 K/uL  Wet prep, genital     Status: Abnormal   Collection Time: 12/16/15  6:45 PM  Result Value Ref Range   Yeast Wet Prep HPF POC NONE SEEN NONE SEEN   Trich, Wet Prep PRESENT (A) NONE SEEN   Clue Cells Wet Prep HPF POC PRESENT (A) NONE SEEN   WBC, Wet Prep HPF POC MANY (A) NONE SEEN   Sperm NONE SEEN   Rapid strep screen (not at [facility])     Status: None   Collection Time: 12/16/15  6:45 PM  Result Value Ref Range   Streptococcus, Group A Screen (Direct) NEGATIVE NEGATIVE    MAU Course  Procedures  MDM In addition to the UA, a CBC was ordered to rule out PID as the cause of her symptoms.  A wet prep, Gc/chlamydia and HIV testing were performed based on patient history obtained.  A rapid Group A strep test was also obtained.  The patient was offered treatment for Gc/ chlamydia and informed of what to do in the event she declined treatment when results are still pending.  Assessment and Plan  1. Positive STD testing for:  Trichomonas- Flagyl 2g PO single dose  Chlamydia- Azithromycin 1g PO single dose  Gonorrhea- 250mg  IM single dose   2. Sore throat with white plaques.  The  patient was informed of the negative rapid strep test and encouraged to follow up with PCP or go to urgent care should the symptoms fail to resolve within a week or worsen significantly.   Discharge home after above treatments given.  Richard Godine PA-S  12/16/2015, 8:25 PM   I saw this pt and agree with the above student's  note. See my complete note.  LEFTWICH-KIRBY, LISA, CNM 3:36 PM

## 2015-12-17 LAB — HEPATITIS C ANTIBODY (REFLEX)

## 2015-12-17 LAB — GC/CHLAMYDIA PROBE AMP (~~LOC~~) NOT AT ARMC
Chlamydia: NEGATIVE
Neisseria Gonorrhea: NEGATIVE

## 2015-12-17 LAB — HCV COMMENT:

## 2015-12-17 LAB — RPR: RPR: NONREACTIVE

## 2015-12-17 LAB — HIV ANTIBODY (ROUTINE TESTING W REFLEX): HIV Screen 4th Generation wRfx: NONREACTIVE

## 2015-12-18 LAB — URINE CULTURE

## 2015-12-19 LAB — CULTURE, GROUP A STREP (THRC)

## 2016-01-12 ENCOUNTER — Encounter: Payer: Self-pay | Admitting: *Deleted

## 2016-03-04 ENCOUNTER — Encounter (HOSPITAL_COMMUNITY): Payer: Self-pay | Admitting: *Deleted

## 2016-03-04 ENCOUNTER — Inpatient Hospital Stay (HOSPITAL_COMMUNITY)
Admission: AD | Admit: 2016-03-04 | Discharge: 2016-03-04 | Disposition: A | Discharge status: Home / Self Care | Payer: Medicaid Other | Source: Ambulatory Visit | Attending: Obstetrics & Gynecology | Admitting: Obstetrics & Gynecology

## 2016-03-04 DIAGNOSIS — Z202 Contact with and (suspected) exposure to infections with a predominantly sexual mode of transmission: Secondary | ICD-10-CM | POA: Diagnosis present

## 2016-03-04 DIAGNOSIS — Z113 Encounter for screening for infections with a predominantly sexual mode of transmission: Secondary | ICD-10-CM

## 2016-03-04 LAB — URINALYSIS, ROUTINE W REFLEX MICROSCOPIC
Bilirubin Urine: NEGATIVE
Glucose, UA: NEGATIVE mg/dL
Hgb urine dipstick: NEGATIVE
Ketones, ur: NEGATIVE mg/dL
LEUKOCYTES UA: NEGATIVE
NITRITE: NEGATIVE
PROTEIN: NEGATIVE mg/dL
pH: 6 (ref 5.0–8.0)

## 2016-03-04 LAB — POCT PREGNANCY, URINE: Preg Test, Ur: NEGATIVE

## 2016-03-04 LAB — WET PREP, GENITAL
CLUE CELLS WET PREP: NONE SEEN
Sperm: NONE SEEN
TRICH WET PREP: NONE SEEN
YEAST WET PREP: NONE SEEN

## 2016-03-04 NOTE — MAU Note (Signed)
Pt presents to MAU for STD testing. PT states someone called her from an unknown number telling her that her partner was HIV positive. Pt denies any pain. Reports she is on her menstrual cycle at this time

## 2016-03-04 NOTE — Discharge Instructions (Signed)
Safe Sex  Safe sex is about reducing the risk of giving or getting a sexually transmitted disease (STD). STDs are spread through sexual contact involving the genitals, mouth, or rectum. Some STDs can be cured and others cannot. Safe sex can also prevent unintended pregnancies.   WHAT ARE SOME SAFE SEX PRACTICES?  · Limit your sexual activity to only one partner who is having sex with only you.  · Talk to your partner about his or her past partners, past STDs, and drug use.  · Use a condom every time you have sexual intercourse. This includes vaginal, oral, and anal sexual activity. Both females and males should wear condoms during oral sex. Only use latex or polyurethane condoms and water-based lubricants. Using petroleum-based lubricants or oils to lubricate a condom will weaken the condom and increase the chance that it will break. The condom should be in place from the beginning to the end of sexual activity. Wearing a condom reduces, but does not completely eliminate, your risk of getting or giving an STD. STDs can be spread by contact with infected body fluids and skin.  · Get vaccinated for hepatitis B and HPV.  · Avoid alcohol and recreational drugs, which can affect your judgment. You may forget to use a condom or participate in high-risk sex.  · For females, avoid douching after sexual intercourse. Douching can spread an infection farther into the reproductive tract.  · Check your body for signs of sores, blisters, rashes, or unusual discharge. See your health care provider if you notice any of these signs.  · Avoid sexual contact if you have symptoms of an infection or are being treated for an STD. If you or your partner has herpes, avoid sexual contact when blisters are present. Use condoms at all other times.  · If you are at risk of being infected with HIV, it is recommended that you take a prescription medicine daily to prevent HIV infection. This is called pre-exposure prophylaxis (PrEP). You are  considered at risk if:    You are a man who has sex with other men (MSM).    You are a heterosexual man or woman who is sexually active with more than one partner.    You take drugs by injection.    You are sexually active with a partner who has HIV.  · Talk with your health care provider about whether you are at high risk of being infected with HIV. If you choose to begin PrEP, you should first be tested for HIV. You should then be tested every 3 months for as long as you are taking PrEP.  · See your health care provider for regular screenings, exams, and tests for other STDs. Before having sex with a new partner, each of you should be screened for STDs and should talk about the results with each other.  WHAT ARE THE BENEFITS OF SAFE SEX?   · There is less chance of getting or giving an STD.  · You can prevent unwanted or unintended pregnancies.  · By discussing safe sex concerns with your partner, you may increase feelings of intimacy, comfort, trust, and honesty between the two of you.     This information is not intended to replace advice given to you by your health care provider. Make sure you discuss any questions you have with your health care provider.     Document Released: 05/20/2004 Document Revised: 05/03/2014 Document Reviewed: 10/04/2011  Elsevier Interactive Patient Education ©2016 [employer]

## 2016-03-04 NOTE — MAU Provider Note (Signed)
History     CSN: [id number]  Arrival date and time: 03/04/16 2107   First Provider Initiated Contact with Patient 03/04/16 2133        Chief Complaint  Patient presents with  . STD testing   HPI Jaclyn Tyler is a 25 y.o. [phone] female who presents for STD testing. Patient has been involved with partner for 1 year. Does not use condoms. States she was called by an anonymous female and was told that her current partner has HIV. Pt presents today to be tested for HIV. LMP 11/9.   OB History    Gravida Para Term Preterm AB Living   5 3 3  0 2 3   SAB TAB Ectopic Multiple Live Births   2 0 0 0 3      Past Medical History:  Diagnosis Date  . Anemia   . Anxiety   . Constipation   . GERD (gastroesophageal reflux disease)   . Gonorrhea     Past Surgical History:  Procedure Laterality Date  . WISDOM TOOTH EXTRACTION      History reviewed. No pertinent family history.  Social History  Substance Use Topics  . Smoking status: Never Smoker  . Smokeless tobacco: Never Used  . Alcohol use Yes     Comment: occasion  LAST TIME-  04-02-2014    Allergies:  Allergies  Allergen Reactions  . Mushroom Extract Complex Hives  . Other Hives    Pt is allergic to paprika seasoning    Prescriptions Prior to Admission  Medication Sig Dispense Refill Last Dose  . cyanocobalamin 1000 MCG tablet Take 1,000 mcg by mouth daily.   Past Month at Unknown time    Review of Systems  Constitutional: Negative.   Gastrointestinal: Negative.   Genitourinary: Negative.    Physical Exam   Blood pressure 146/89, pulse 86, temperature 98.2 F (36.8 C), resp. rate 18, last menstrual period 03/04/2016, unknown if currently breastfeeding.  Physical Exam  Nursing note and vitals reviewed. Constitutional: She is oriented to person, place, and time. She appears well-developed and well-nourished. She appears distressed.  HENT:  Head: Normocephalic and atraumatic.  Eyes: Conjunctivae are  normal. Right eye exhibits no discharge. Left eye exhibits no discharge. No scleral icterus.  Neck: Normal range of motion.  Respiratory: Effort normal. No respiratory distress.  Neurological: She is alert and oriented to person, place, and time.  Skin: Skin is warm and dry. She is not diaphoretic.  Psychiatric: She has a normal mood and affect. Her behavior is normal. Judgment and thought content normal.    MAU Course  Procedures Results for orders placed or performed during the hospital encounter of 03/04/16 (from the past 24 hour(s))  Urinalysis, Routine w reflex microscopic (not [facility])     Status: Abnormal   Collection Time: 03/04/16  9:15 PM  Result Value Ref Range   Color, Urine YELLOW YELLOW   APPearance CLEAR CLEAR   Specific Gravity, Urine >1.030 (H) 1.005 - 1.030   pH 6.0 5.0 - 8.0   Glucose, UA NEGATIVE NEGATIVE mg/dL   Hgb urine dipstick NEGATIVE NEGATIVE   Bilirubin Urine NEGATIVE NEGATIVE   Ketones, ur NEGATIVE NEGATIVE mg/dL   Protein, ur NEGATIVE NEGATIVE mg/dL   Nitrite NEGATIVE NEGATIVE   Leukocytes, UA NEGATIVE NEGATIVE  Pregnancy, urine POC     Status: None   Collection Time: 03/04/16  9:36 PM  Result Value Ref Range   Preg Test, Ur NEGATIVE NEGATIVE  Wet prep, genital  Status: Abnormal   Collection Time: 03/04/16  9:40 PM  Result Value Ref Range   Yeast Wet Prep HPF POC NONE SEEN NONE SEEN   Trich, Wet Prep NONE SEEN NONE SEEN   Clue Cells Wet Prep HPF POC NONE SEEN NONE SEEN   WBC, Wet Prep HPF POC FEW (A) NONE SEEN   Sperm NONE SEEN     MDM UPT negative HIV, HCV, RPR, GC/CT, wet prep  Assessment and Plan  A: 1. Screen for STD (sexually transmitted disease)    P; Discharge home HIV, HCV, RPR, GC/CT pending Will call for positive results Safe sex discussed [facility] months for repeat testing  Judeth Hornrin Justice Aguirre 03/04/2016, 9:33 PM

## 2016-03-05 LAB — RPR: RPR: NONREACTIVE

## 2016-03-05 LAB — GC/CHLAMYDIA PROBE AMP (~~LOC~~) NOT AT ARMC
CHLAMYDIA, DNA PROBE: NEGATIVE
NEISSERIA GONORRHEA: NEGATIVE

## 2016-03-05 LAB — HIV ANTIBODY (ROUTINE TESTING W REFLEX): HIV SCREEN 4TH GENERATION: NONREACTIVE

## 2016-03-06 LAB — HEPATITIS C ANTIBODY: HCV Ab: 0.1 s/co ratio (ref 0.0–0.9)

## 2016-05-13 ENCOUNTER — Ambulatory Visit (HOSPITAL_COMMUNITY)
Admission: EM | Admit: 2016-05-13 | Discharge: 2016-05-13 | Disposition: A | Discharge status: Home / Self Care | Payer: Medicaid Other | Attending: Emergency Medicine | Admitting: Emergency Medicine

## 2016-05-13 ENCOUNTER — Encounter (HOSPITAL_COMMUNITY): Payer: Self-pay | Admitting: Emergency Medicine

## 2016-05-13 DIAGNOSIS — Z202 Contact with and (suspected) exposure to infections with a predominantly sexual mode of transmission: Secondary | ICD-10-CM | POA: Insufficient documentation

## 2016-05-13 MED ORDER — LIDOCAINE HCL (PF) 1 % IJ SOLN
INTRAMUSCULAR | Status: AC
  Filled 2016-05-13: qty 2

## 2016-05-13 MED ORDER — AZITHROMYCIN 250 MG PO TABS
ORAL_TABLET | ORAL | Status: AC
  Filled 2016-05-13: qty 4

## 2016-05-13 MED ORDER — AZITHROMYCIN 250 MG PO TABS
1000.0000 mg | ORAL_TABLET | Freq: Once | ORAL | Status: AC
  Administered 2016-05-13: 1000 mg via ORAL

## 2016-05-13 MED ORDER — CEFTRIAXONE SODIUM 250 MG IJ SOLR
250.0000 mg | Freq: Once | INTRAMUSCULAR | Status: AC
  Administered 2016-05-13: 250 mg via INTRAMUSCULAR

## 2016-05-13 MED ORDER — CEFTRIAXONE SODIUM 250 MG IJ SOLR
INTRAMUSCULAR | Status: AC
  Filled 2016-05-13: qty 250

## 2016-05-13 NOTE — ED Triage Notes (Signed)
Pt here for STD check... Reports partner is being treated for Gonorrhea  Pt is asymptomatic  A&O x4... NAD

## 2016-05-13 NOTE — Discharge Instructions (Signed)
We went ahead and treated you, just to be safe. Testing will take 2-3 days to come back. We will call you with the results. Follow-up as needed.

## 2016-05-13 NOTE — ED Provider Notes (Signed)
[facility]    CSN: [id number] Arrival date & time: 05/13/16  1433     History   Chief Complaint Chief Complaint  Patient presents with  . Exposure to STD    HPI Jaclyn Tyler is a 26 y.o. female.   HPI  She's a 26 year old woman here for STD exposure. She states her partner tested positive for something a few days ago. He is here for treatment. She denies any symptoms. Denies any urinary symptoms. No vaginal discharge. No abdominal pain. No fevers.  Past Medical History:  Diagnosis Date  . Anemia   . Anxiety   . Constipation   . GERD (gastroesophageal reflux disease)   . Gonorrhea     Patient Active Problem List   Diagnosis Date Noted  . Pregnancy 12/17/2014  . NVD (normal vaginal delivery) 12/17/2014  . [redacted] weeks gestation of pregnancy   . Abdominal pain in pregnancy, antepartum     Past Surgical History:  Procedure Laterality Date  . WISDOM TOOTH EXTRACTION      OB History    Gravida Para Term Preterm AB Living   5 3 3  0 2 3   SAB TAB Ectopic Multiple Live Births   2 0 0 0 3       Home Medications    Prior to Admission medications   Medication Sig Start Date End Date Taking? Authorizing Provider  cyanocobalamin 1000 MCG tablet Take 1,000 mcg by mouth daily.    Historical Provider, MD    Family History No family history on file.  Social History Social History  Substance Use Topics  . Smoking status: Never Smoker  . Smokeless tobacco: Never Used  . Alcohol use Yes     Comment: occasion  LAST TIME-  04-02-2014     Allergies   Mushroom extract complex and Other   Review of Systems Review of Systems As in history of present illness  Physical Exam Triage Vital Signs ED Triage Vitals [05/13/16 1516]  Enc Vitals Group     BP 127/80     Pulse Rate 90     Resp 18     Temp 98.2 F (36.8 C)     Temp Source Oral     SpO2 100 %     Weight      Height      Head Circumference      Peak Flow      Pain Score      Pain Loc        Pain Edu?      Excl. in GC?    No data found.   Updated Vital Signs BP 127/80 (BP Location: Left Arm)   Pulse 90   Temp 98.2 F (36.8 C) (Oral)   Resp 18   LMP 05/13/2016   SpO2 100%   Breastfeeding? No   Visual Acuity Right Eye Distance:   Left Eye Distance:   Bilateral Distance:    Right Eye Near:   Left Eye Near:    Bilateral Near:     Physical Exam  Constitutional: She is oriented to person, place, and time. She appears well-developed and well-nourished. No distress.  Cardiovascular: Normal rate.   Pulmonary/Chest: Effort normal.  Genitourinary:  Genitourinary Comments: Deferred  Neurological: She is alert and oriented to person, place, and time.     UC Treatments / Results  Labs (all labs ordered are listed, but only abnormal results are displayed) Labs Reviewed  URINE CYTOLOGY ANCILLARY ONLY  EKG  EKG Interpretation None       Radiology No results found.  Procedures Procedures (including critical care time)  Medications Ordered in UC Medications  azithromycin (ZITHROMAX) tablet 1,000 mg (1,000 mg Oral Given 05/13/16 1602)  cefTRIAXone (ROCEPHIN) injection 250 mg (250 mg Intramuscular Given 05/13/16 1603)     Initial Impression / Assessment and Plan / UC Course  I have reviewed the triage vital signs and the nursing notes.  Pertinent labs & imaging results that were available during my care of the patient were reviewed by me and considered in my medical decision making (see chart for details).     Treated presumptively with Rocephin and azithromycin. Urine sent for STD testing. Patient recently had negative HIV and RPR.  Final Clinical Impressions(s) / UC Diagnoses   Final diagnoses:  STD exposure    New Prescriptions Discharge Medication List as of 05/13/2016  3:51 PM       Charm Rings, MD 05/13/16 1620

## 2016-05-14 LAB — URINE CYTOLOGY ANCILLARY ONLY
CHLAMYDIA, DNA PROBE: NEGATIVE
Neisseria Gonorrhea: NEGATIVE
Trichomonas: NEGATIVE

## 2016-05-17 ENCOUNTER — Encounter (HOSPITAL_COMMUNITY): Payer: Self-pay | Admitting: Emergency Medicine

## 2016-05-17 ENCOUNTER — Emergency Department (HOSPITAL_COMMUNITY)
Admission: EM | Admit: 2016-05-17 | Discharge: 2016-05-17 | Disposition: A | Discharge status: Home / Self Care | Payer: Medicaid Other | Attending: Emergency Medicine | Admitting: Emergency Medicine

## 2016-05-17 DIAGNOSIS — J029 Acute pharyngitis, unspecified: Secondary | ICD-10-CM | POA: Diagnosis present

## 2016-05-17 DIAGNOSIS — I889 Nonspecific lymphadenitis, unspecified: Secondary | ICD-10-CM | POA: Insufficient documentation

## 2016-05-17 DIAGNOSIS — Z79899 Other long term (current) drug therapy: Secondary | ICD-10-CM | POA: Diagnosis not present

## 2016-05-17 LAB — RAPID STREP SCREEN (MED CTR MEBANE ONLY): Streptococcus, Group A Screen (Direct): NEGATIVE

## 2016-05-17 MED ORDER — AMOXICILLIN-POT CLAVULANATE 875-125 MG PO TABS: 1.0000 | ORAL_TABLET | Freq: Two times a day (BID) | ORAL | 0 refills | Status: DC

## 2016-05-17 MED ORDER — IBUPROFEN 800 MG PO TABS: 800.0000 mg | ORAL_TABLET | Freq: Three times a day (TID) | ORAL | 0 refills | Status: DC | PRN

## 2016-05-17 MED ORDER — IBUPROFEN 200 MG PO TABS
600.0000 mg | ORAL_TABLET | Freq: Once | ORAL | Status: AC
  Administered 2016-05-17: 600 mg via ORAL
  Filled 2016-05-17: qty 3

## 2016-05-17 NOTE — Discharge Instructions (Signed)
Read the information below.  Use the prescribed medication as directed.  Please discuss all new medications with your pharmacist.  You may return to the Emergency Department at any time for worsening condition or any new symptoms that concern you.   If you develop high fevers, difficulty swallowing or breathing, or you are unable to tolerate fluids by mouth, return to the ER immediately for a recheck.    °

## 2016-05-17 NOTE — ED Provider Notes (Signed)
[facility] Provider Note   CSN: [id number] Arrival date & time: 05/17/16  1706   By signing my name below, I, Jaclyn Tyler, attest that this documentation has been prepared under the direction and in the presence of  [facility] Yarah Fuente, PA-C. Electronically Signed: Clovis PuAvnee Tyler, ED Scribe. 05/17/16. 6:00 PM.   History   Chief Complaint Chief Complaint  Patient presents with  . Lympth Node Enlargement  . Otalgia   The history is provided by the patient. No language interpreter was used.   HPI Comments:  Jaclyn Tyler is a 26 y.o. female who presents to the Emergency Department complaining of right ear pain that began yesterday. She also reports bilateral facial swelling, a headache and a mild sore throat. No alleviating factors noted. Pt denies dental pain, fevers, chills, body aches, cough, nasal congestion, a hx of TMJ problems, any recent travel and any other associated symptoms at this time. Pt reports recent sick contacts at home and notes her daughter (UTD on vaccinations) is sick. Pt is UTD on vaccinations. No PCP.   Past Medical History:  Diagnosis Date  . Anemia   . Anxiety   . Constipation   . GERD (gastroesophageal reflux disease)   . Gonorrhea     Patient Active Problem List   Diagnosis Date Noted  . Pregnancy 12/17/2014  . NVD (normal vaginal delivery) 12/17/2014  . [redacted] weeks gestation of pregnancy   . Abdominal pain in pregnancy, antepartum     Past Surgical History:  Procedure Laterality Date  . WISDOM TOOTH EXTRACTION      OB History    Gravida Para Term Preterm AB Living   5 3 3  0 2 3   SAB TAB Ectopic Multiple Live Births   2 0 0 0 3       Home Medications    Prior to Admission medications   Medication Sig Start Date End Date Taking? Authorizing Provider  amoxicillin-clavulanate (AUGMENTIN) 875-125 MG tablet Take 1 tablet by mouth every 12 (twelve) hours. 05/17/16   Trixie DredgeEmily Sunya Humbarger, PA-C  cyanocobalamin 1000 MCG tablet Take 1,000 mcg by mouth  daily.    Historical Provider, MD  ibuprofen (ADVIL,MOTRIN) 800 MG tablet Take 1 tablet (800 mg total) by mouth every 8 (eight) hours as needed for mild pain or moderate pain. 05/17/16   Trixie DredgeEmily Soni Kegel, PA-C    Family History No family history on file.  Social History Social History  Substance Use Topics  . Smoking status: Never Smoker  . Smokeless tobacco: Never Used  . Alcohol use Yes     Comment: occasion  LAST TIME-  04-02-2014     Allergies   Mushroom extract complex and Other   Review of Systems Review of Systems  Constitutional: Negative for chills and fever.  HENT: Positive for ear pain, facial swelling and sore throat. Negative for dental problem.   Respiratory: Negative for cough.   Musculoskeletal: Negative for myalgias.  Neurological: Positive for headaches.     Physical Exam Updated Vital Signs BP 118/68   Pulse (!) 54   Temp 98.1 F (36.7 C) (Oral)   Resp 18   Ht 6\' 1"  (1.854 m)   Wt 108.9 kg   LMP 05/13/2016   SpO2 94%   BMI 31.66 kg/m   Physical Exam  Constitutional: She appears well-developed and well-nourished. No distress.  HENT:  Head: Normocephalic and atraumatic.  Right Ear: Tympanic membrane and ear canal normal.  Left Ear: Tympanic membrane and ear canal normal.  Mouth/Throat: Posterior oropharyngeal erythema present. No oropharyngeal exudate or posterior oropharyngeal edema.  Bilateral mandibular and submandibular fullness and tenderness without overlying erythema. No maxillary sinus tenderness. Right ear pain with pressure on tragus. Bilateral ear canals and TMs normal. No abnormality of dentition noted.  Eyes: Conjunctivae are normal.  Neck: Neck supple.  Cardiovascular: Normal rate and regular rhythm.   Pulmonary/Chest: Effort normal and breath sounds normal. No respiratory distress. She has no wheezes. She has no rales.  Musculoskeletal: She exhibits tenderness.  Lymphadenopathy:  No submental anterior or posterior, cervical posterior  auricular, supraclavicular or axillary lymphadenopathy noted.   Neurological: She is alert.  Skin: She is not diaphoretic. No erythema.  Nursing note and vitals reviewed.   ED Treatments / Results  DIAGNOSTIC STUDIES:  Oxygen Saturation is 98% on RA, normal by my interpretation.    COORDINATION OF CARE:  5:54 PM Discussed treatment plan with pt at bedside and pt agreed to plan.  Labs (all labs ordered are listed, but only abnormal results are displayed) Labs Reviewed  RAPID STREP SCREEN (NOT AT [facility])  CULTURE, GROUP A STREP [facility])    EKG  EKG Interpretation None       Radiology No results found.  Procedures Procedures (including critical care time)  Medications Ordered in ED Medications  ibuprofen (ADVIL,MOTRIN) tablet 600 mg (600 mg Oral Given 05/17/16 1815)     Initial Impression / Assessment and Plan / ED Course  I have reviewed the triage vital signs and the nursing notes.  Pertinent labs & imaging results that were available during my care of the patient were reviewed by me and considered in my medical decision making (see chart for details).     Afebrile, nontoxic patient with mandibular and submandibular swelling.  The left side is larger than the right.  She may have an early viral syndrome with reactive lymphadenopathy though as this is her primary complaint and the URI symptoms appear quite minor, I think it is more likely she has lymphadenitis or possible sialadenitis.   No airway concerns.  Does not appear to be dental in origin.  No trauma.  D/C home with motrin, augmentin, 2 day recheck.  Discussed result, findings, treatment, and follow up  with patient.  Pt given return precautions.  Pt verbalizes understanding and agrees with plan.       Final Clinical Impressions(s) / ED Diagnoses   Final diagnoses:  Lymphadenitis    New Prescriptions Discharge Medication List as of 05/17/2016  7:16 PM    START taking these medications   Details    amoxicillin-clavulanate (AUGMENTIN) 875-125 MG tablet Take 1 tablet by mouth every 12 (twelve) hours., Starting Mon 05/17/2016, Print    ibuprofen (ADVIL,MOTRIN) 800 MG tablet Take 1 tablet (800 mg total) by mouth every 8 (eight) hours as needed for mild pain or moderate pain., Starting Mon 05/17/2016, Print       I personally performed the services described in this documentation, which was scribed in my presence. The recorded information has been reviewed and is accurate.     Trixie Dredge, PA-C 05/17/16 2020    Derwood Kaplan, MD 05/18/16 [phone]

## 2016-05-17 NOTE — ED Notes (Signed)
Post triage pt verbalizes right ear pain onset yesterday.

## 2016-05-17 NOTE — ED Triage Notes (Signed)
Pt verbalizes lymph nodes enlarged onset yesterday; pt denies recent sickness or pain.

## 2016-05-20 LAB — CULTURE, GROUP A STREP (THRC)

## 2016-07-26 ENCOUNTER — Emergency Department (HOSPITAL_COMMUNITY): Payer: Medicaid Other

## 2016-07-26 ENCOUNTER — Emergency Department (HOSPITAL_COMMUNITY)
Admission: EM | Admit: 2016-07-26 | Discharge: 2016-07-27 | Disposition: A | Discharge status: Home / Self Care | Payer: Medicaid Other | Attending: Emergency Medicine | Admitting: Emergency Medicine

## 2016-07-26 ENCOUNTER — Encounter (HOSPITAL_COMMUNITY): Payer: Self-pay

## 2016-07-26 DIAGNOSIS — E860 Dehydration: Secondary | ICD-10-CM

## 2016-07-26 DIAGNOSIS — R1032 Left lower quadrant pain: Secondary | ICD-10-CM | POA: Diagnosis present

## 2016-07-26 DIAGNOSIS — N390 Urinary tract infection, site not specified: Secondary | ICD-10-CM

## 2016-07-26 LAB — CBC WITH DIFFERENTIAL/PLATELET
Basophils Absolute: 0 10*3/uL (ref 0.0–0.1)
Basophils Relative: 0 %
EOS ABS: 0.1 10*3/uL (ref 0.0–0.7)
Eosinophils Relative: 1 %
HCT: 38.1 % (ref 36.0–46.0)
Hemoglobin: 12 g/dL (ref 12.0–15.0)
Lymphocytes Relative: 9 %
Lymphs Abs: 1.2 10*3/uL (ref 0.7–4.0)
MCH: 21.2 pg — AB (ref 26.0–34.0)
MCHC: 31.5 g/dL (ref 30.0–36.0)
MCV: 67.2 fL — ABNORMAL LOW (ref 78.0–100.0)
MONO ABS: 0.4 10*3/uL (ref 0.1–1.0)
Monocytes Relative: 3 %
NEUTROS PCT: 87 %
Neutro Abs: 11.5 10*3/uL — ABNORMAL HIGH (ref 1.7–7.7)
PLATELETS: 461 10*3/uL — AB (ref 150–400)
RBC: 5.67 MIL/uL — AB (ref 3.87–5.11)
RDW: 19 % — ABNORMAL HIGH (ref 11.5–15.5)
WBC: 13.2 10*3/uL — AB (ref 4.0–10.5)

## 2016-07-26 LAB — URINALYSIS, ROUTINE W REFLEX MICROSCOPIC
Bilirubin Urine: NEGATIVE
GLUCOSE, UA: NEGATIVE mg/dL
Hgb urine dipstick: NEGATIVE
KETONES UR: NEGATIVE mg/dL
Nitrite: NEGATIVE
PH: 7 (ref 5.0–8.0)
Protein, ur: NEGATIVE mg/dL
Specific Gravity, Urine: 1.006 (ref 1.005–1.030)

## 2016-07-26 LAB — COMPREHENSIVE METABOLIC PANEL
ALK PHOS: 112 U/L (ref 38–126)
ALT: 14 U/L (ref 14–54)
AST: 18 U/L (ref 15–41)
Albumin: 3.3 g/dL — ABNORMAL LOW (ref 3.5–5.0)
Anion gap: 9 (ref 5–15)
BUN: 7 mg/dL (ref 6–20)
CALCIUM: 8.7 mg/dL — AB (ref 8.9–10.3)
CO2: 23 mmol/L (ref 22–32)
CREATININE: 0.72 mg/dL (ref 0.44–1.00)
Chloride: 103 mmol/L (ref 101–111)
Glucose, Bld: 84 mg/dL (ref 65–99)
Potassium: 3.8 mmol/L (ref 3.5–5.1)
Sodium: 135 mmol/L (ref 135–145)
TOTAL PROTEIN: 7.5 g/dL (ref 6.5–8.1)
Total Bilirubin: 0.6 mg/dL (ref 0.3–1.2)

## 2016-07-26 LAB — I-STAT BETA HCG BLOOD, ED (MC, WL, AP ONLY): I-stat hCG, quantitative: <5 m[IU]/mL (ref <5)

## 2016-07-26 LAB — I-STAT CG4 LACTIC ACID, ED: Lactic Acid, Venous: 1.34 mmol/L (ref 0.5–1.9)

## 2016-07-26 MED ORDER — SODIUM CHLORIDE 0.9 % IV BOLUS (SEPSIS)
2000.0000 mL | Freq: Once | INTRAVENOUS | Status: AC
  Administered 2016-07-26: 2000 mL via INTRAVENOUS

## 2016-07-26 MED ORDER — CEPHALEXIN 500 MG PO CAPS: 500.0000 mg | ORAL_CAPSULE | Freq: Four times a day (QID) | ORAL | 0 refills | Status: DC

## 2016-07-26 MED ORDER — ONDANSETRON HCL 4 MG/2ML IJ SOLN
4.0000 mg | Freq: Once | INTRAMUSCULAR | Status: AC
  Administered 2016-07-26: 4 mg via INTRAVENOUS
  Filled 2016-07-26: qty 2

## 2016-07-26 MED ORDER — ONDANSETRON HCL 4 MG PO TABS: 4.0000 mg | ORAL_TABLET | Freq: Three times a day (TID) | ORAL | 0 refills | Status: DC | PRN

## 2016-07-26 MED ORDER — DEXTROSE 5 % IV SOLN
1.0000 g | Freq: Once | INTRAVENOUS | Status: AC
  Administered 2016-07-26: 1 g via INTRAVENOUS
  Filled 2016-07-26: qty 10

## 2016-07-26 MED ORDER — HYDROMORPHONE HCL 1 MG/ML IJ SOLN: 1.0000 mg | Freq: Once | INTRAMUSCULAR | Status: DC

## 2016-07-26 MED ORDER — IBUPROFEN 400 MG PO TABS
400.0000 mg | ORAL_TABLET | Freq: Once | ORAL | Status: AC
  Administered 2016-07-26: 400 mg via ORAL
  Filled 2016-07-26: qty 1

## 2016-07-26 NOTE — ED Triage Notes (Signed)
Pt states she woke up this morning with abdominal pain, vomiting, and headache. Pt tachycardic in triage around 135. Skin warm and dry.

## 2016-07-26 NOTE — ED Provider Notes (Signed)
[facility]-EMERGENCY DEPT Provider Note   CSN: [id number] Arrival date & time: 07/26/16  1822     History   Chief Complaint Chief Complaint  Patient presents with  . Abdominal Pain  . Headache    HPI Jaclyn Tyler is a 26 y.o. female.  The history is provided by the patient.  Abdominal Pain   This is a new problem. The current episode started 6 to 12 hours ago. The problem occurs constantly. The problem has not changed since onset.The pain is located in the suprapubic region and LLQ. The pain is moderate. Associated symptoms include diarrhea, nausea, vomiting, dysuria and headaches. Pertinent negatives include frequency and hematuria. Nothing aggravates the symptoms. Nothing relieves the symptoms.  Headache   Associated symptoms include nausea and vomiting.    Past Medical History:  Diagnosis Date  . Anemia   . Anxiety   . Constipation   . GERD (gastroesophageal reflux disease)   . Gonorrhea     Patient Active Problem List   Diagnosis Date Noted  . Pregnancy 12/17/2014  . NVD (normal vaginal delivery) 12/17/2014  . [redacted] weeks gestation of pregnancy   . Abdominal pain in pregnancy, antepartum     Past Surgical History:  Procedure Laterality Date  . WISDOM TOOTH EXTRACTION      OB History    Gravida Para Term Preterm AB Living   0 2 3   SAB TAB Ectopic Multiple Live Births   2 0 0 0 3       Home Medications    Prior to Admission medications   Medication Sig Start Date End Date Taking? Authorizing Provider  cephALEXin (KEFLEX) 500 MG capsule Take 1 capsule (500 mg total) by mouth 4 (four) times daily. 07/26/16   Marily Memos, MD  ondansetron (ZOFRAN) 4 MG tablet Take 1 tablet (4 mg total) by mouth every 8 (eight) hours as needed for nausea or vomiting. 07/26/16   Marily Memos, MD    Family History History reviewed. No pertinent family history.  Social History Social History  Substance Use Topics  . Smoking status: Never Smoker  . Smokeless tobacco:  Never Used  . Alcohol use Yes     Comment: occasion  LAST TIME-  04-02-2014     Allergies   Mushroom extract complex and Other   Review of Systems Review of Systems  Gastrointestinal: Positive for abdominal pain, diarrhea, nausea and vomiting.  Genitourinary: Positive for dysuria. Negative for frequency and hematuria.  Neurological: Positive for headaches.  All other systems reviewed and are negative.    Physical Exam Updated Vital Signs BP (!) 93/56   Pulse 90   Temp 99.1 F (37.3 C) (Oral)   Resp 15   LMP 07/13/2016 (Approximate)   SpO2 99%   Physical Exam  Constitutional: She appears well-developed and well-nourished.  HENT:  Head: Normocephalic and atraumatic.  Eyes: Conjunctivae and EOM are normal.  Neck: Normal range of motion.  Cardiovascular: Normal rate and regular rhythm.   Pulmonary/Chest: No stridor. No respiratory distress.  Abdominal: Soft. She exhibits no distension. There is tenderness (mild suprapubic/llq).  Neurological: She is alert.  Skin: Skin is warm and dry.  Nursing note and vitals reviewed.    ED Treatments / Results  Labs (all labs ordered are listed, but only abnormal results are displayed) Labs Reviewed  URINE CULTURE - Abnormal; Notable for the following:       Result Value   Culture MULTIPLE SPECIES PRESENT, SUGGEST RECOLLECTION (*)  All other components within normal limits  COMPREHENSIVE METABOLIC PANEL - Abnormal; Notable for the following:    Calcium 8.7 (*)    Albumin 3.3 (*)    All other components within normal limits  CBC WITH DIFFERENTIAL/PLATELET - Abnormal; Notable for the following:    WBC 13.2 (*)    RBC 5.67 (*)    MCV 67.2 (*)    MCH 21.2 (*)    RDW 19.0 (*)    Platelets 461 (*)    Neutro Abs 11.5 (*)    All other components within normal limits  URINALYSIS, ROUTINE W REFLEX MICROSCOPIC - Abnormal; Notable for the following:    APPearance HAZY (*)    Leukocytes, UA MODERATE (*)    Bacteria, UA RARE (*)      Squamous Epithelial / LPF 0-5 (*)    All other components within normal limits  I-STAT CG4 LACTIC ACID, ED  I-STAT BETA HCG BLOOD, ED ([facility], [facility], [facility] ONLY)    EKG  EKG Interpretation  Date/Time:  Monday July 26 2016 18:37:21 EDT Ventricular Rate:  128 PR Interval:  134 QRS Duration: 66 QT Interval:  316 QTC Calculation: 461 R Axis:   52 Text Interpretation:  Sinus tachycardia Right atrial enlargement Cannot rule out Anterior infarct , age undetermined Abnormal ECG Confirmed by [facility] MD, Talbert Trembath [phone]) on 07/26/2016 7:56:28 PM       Radiology Dg Chest 2 View  Result Date: 07/26/2016 CLINICAL DATA:  Sepsis EXAM: CHEST  2 VIEW COMPARISON:  None. FINDINGS: The heart size and mediastinal contours are within normal limits. Both lungs are clear. There is scoliosis of spine. IMPRESSION: No active cardiopulmonary disease. Electronically Signed   By: Sherian Rein M.D.   On: 07/26/2016 19:18    Procedures Procedures (including critical care time)  Medications Ordered in ED Medications  sodium chloride 0.9 % bolus 2,000 mL (0 mLs Intravenous Stopped 07/27/16 0005)  ondansetron (ZOFRAN) injection 4 mg (4 mg Intravenous Given 07/26/16 2135)  ibuprofen (ADVIL,MOTRIN) tablet 400 mg (400 mg Oral Given 07/26/16 2133)  cefTRIAXone (ROCEPHIN) 1 g in dextrose 5 % 50 mL IVPB (0 g Intravenous Stopped 07/27/16 0005)     Initial Impression / Assessment and Plan / ED Course  I have reviewed the triage vital signs and the nursing notes.  Pertinent labs & imaging results that were available during my care of the patient were reviewed by me and considered in my medical decision making (see chart for details).     Likely gastroenteritis but suspect early UTI as well as she had some dysuria and suprapubic ttp. Will treat with rocephin. zofran. Fluids for dehydration.  Improved some with interentions. Will continue hydration at home. Will co ntnue zofran. Likely UTI, started with rocephin will continue with  keflex.   Final Clinical Impressions(s) / ED Diagnoses   Final diagnoses:  Lower urinary tract infectious disease  Dehydration    New Prescriptions Discharge Medication List as of 07/26/2016 11:31 PM    START taking these medications   Details  cephALEXin (KEFLEX) 500 MG capsule Take 1 capsule (500 mg total) by mouth 4 (four) times daily., Starting Mon 07/26/2016, Print         Marily Memos, MD 07/28/16 [phone]

## 2016-07-28 LAB — URINE CULTURE

## 2016-08-13 IMAGING — CR DG KNEE COMPLETE 4+V*L*
4 series · 4 of 4 positions shown · non-contrast
Comparison: 02/06/2012

CLINICAL DATA: Left knee pain starting 1 year ago after a fall,
chronic.

EXAM:
LEFT KNEE - COMPLETE 4+ VIEW

[knee ap]
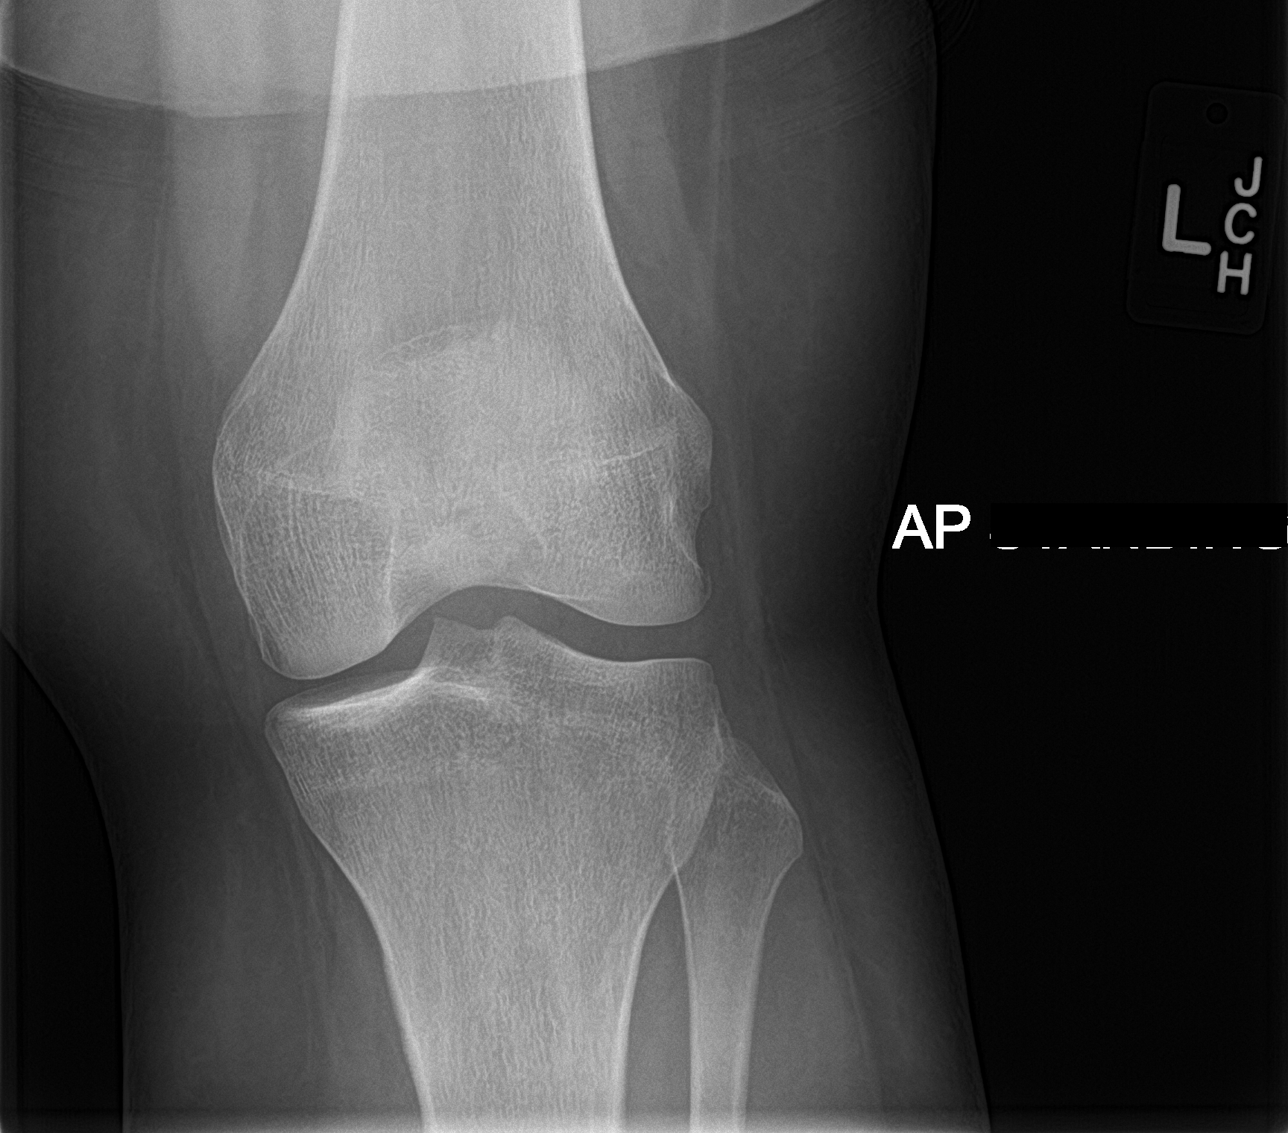

[tunnel]
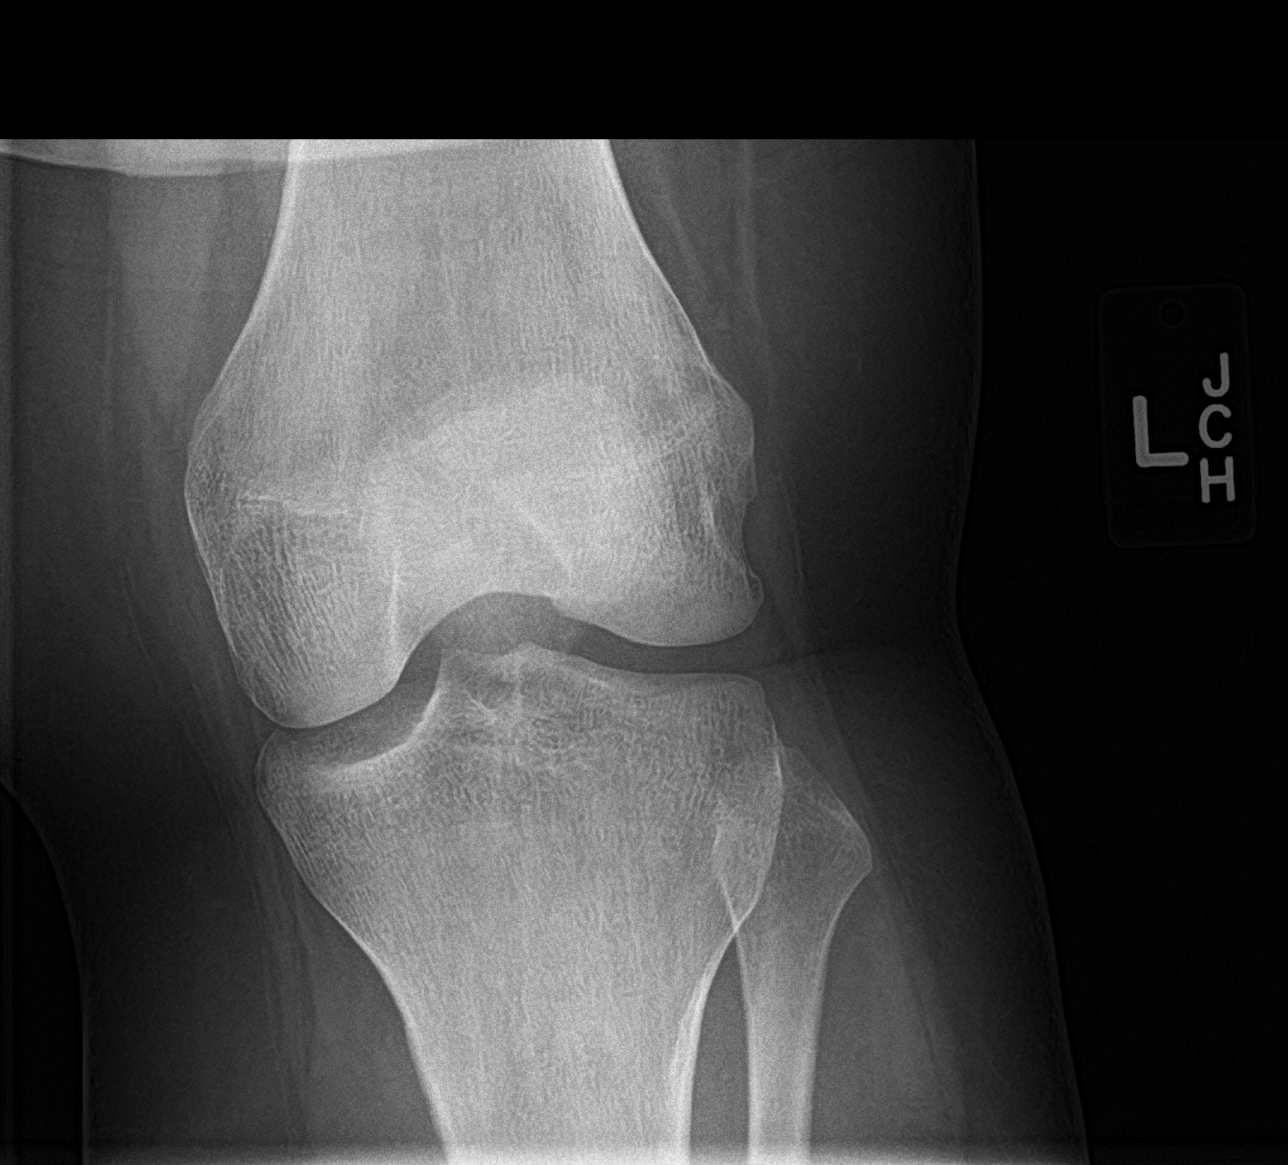

[knee lat]
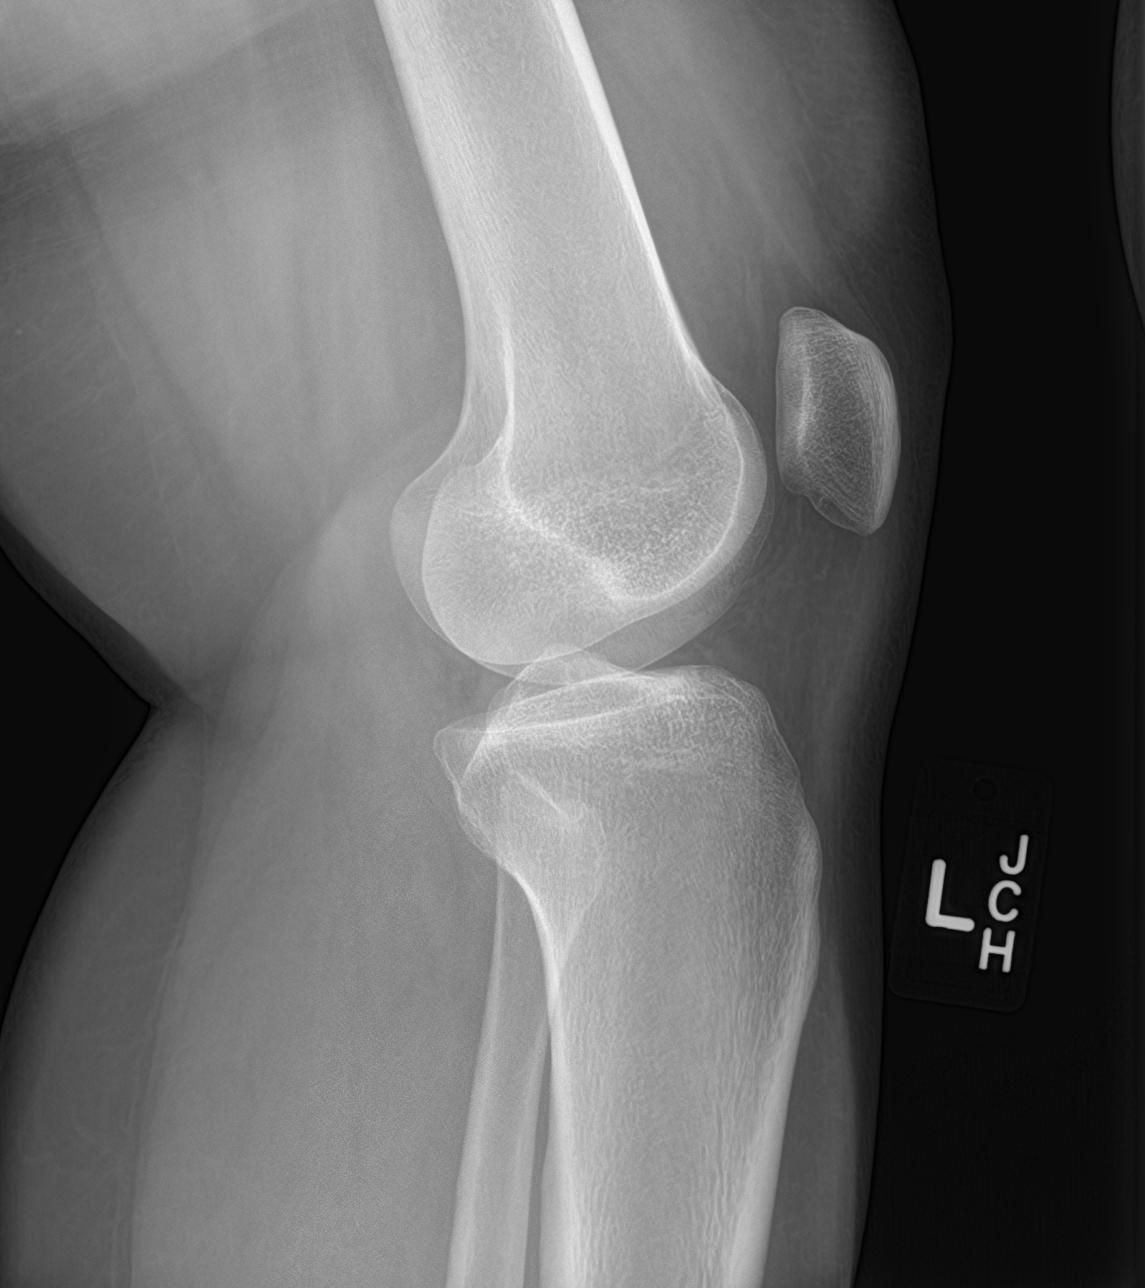

[knee sunrise]
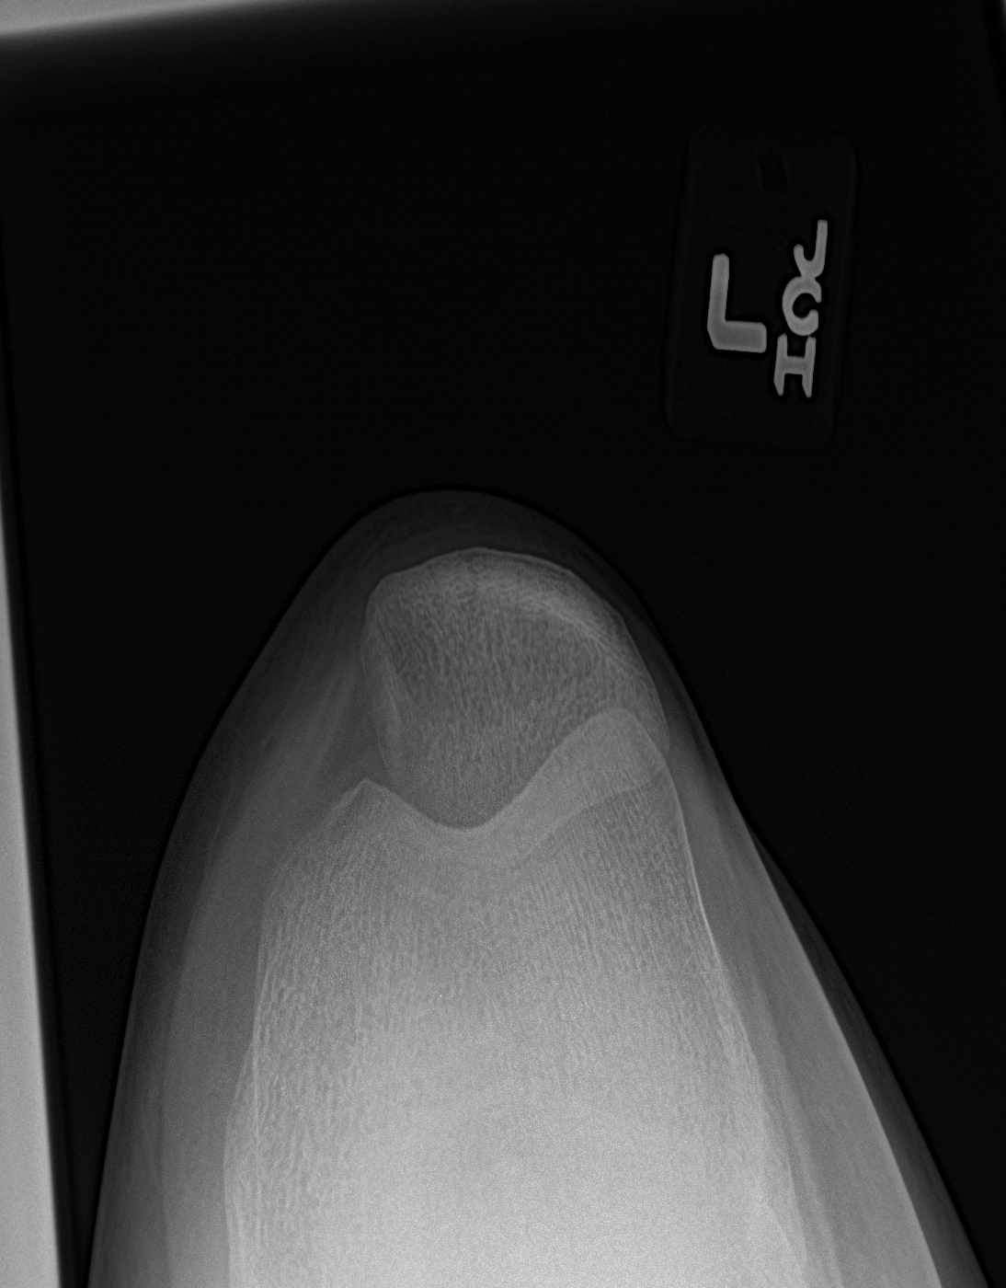

[4 of 4 positions shown; findings below may reference images not displayed]

FINDINGS: Equivocal medial compartmental narrowing. No appreciable knee
effusion or significant spurring. No significant bony abnormality
observed.
IMPRESSION: 1. Equivocal medial compartmental narrowing, potentially
degenerative. Otherwise, no significant abnormalities are observed.

## 2016-08-30 ENCOUNTER — Inpatient Hospital Stay (HOSPITAL_COMMUNITY)
Admission: AD | Admit: 2016-08-30 | Discharge: 2016-08-30 | Disposition: A | Discharge status: Home / Self Care | Payer: Medicaid Other | Source: Ambulatory Visit | Attending: Obstetrics & Gynecology | Admitting: Obstetrics & Gynecology

## 2016-08-30 ENCOUNTER — Telehealth (HOSPITAL_COMMUNITY): Payer: Self-pay

## 2016-08-30 DIAGNOSIS — N898 Other specified noninflammatory disorders of vagina: Secondary | ICD-10-CM | POA: Insufficient documentation

## 2016-08-30 DIAGNOSIS — R300 Dysuria: Secondary | ICD-10-CM | POA: Diagnosis present

## 2016-08-30 DIAGNOSIS — N3000 Acute cystitis without hematuria: Secondary | ICD-10-CM | POA: Diagnosis not present

## 2016-08-30 DIAGNOSIS — N760 Acute vaginitis: Secondary | ICD-10-CM

## 2016-08-30 DIAGNOSIS — B9689 Other specified bacterial agents as the cause of diseases classified elsewhere: Secondary | ICD-10-CM | POA: Diagnosis not present

## 2016-08-30 LAB — WET PREP, GENITAL
Sperm: NONE SEEN
TRICH WET PREP: NONE SEEN
YEAST WET PREP: NONE SEEN

## 2016-08-30 LAB — URINALYSIS, ROUTINE W REFLEX MICROSCOPIC
Bilirubin Urine: NEGATIVE
Glucose, UA: NEGATIVE mg/dL
Hgb urine dipstick: NEGATIVE
Ketones, ur: NEGATIVE mg/dL
Nitrite: NEGATIVE
PROTEIN: NEGATIVE mg/dL
Specific Gravity, Urine: 1.019 (ref 1.005–1.030)
pH: 7 (ref 5.0–8.0)

## 2016-08-30 LAB — POCT PREGNANCY, URINE: PREG TEST UR: NEGATIVE

## 2016-08-30 MED ORDER — NITROFURANTOIN MONOHYD MACRO 100 MG PO CAPS: 100.0000 mg | ORAL_CAPSULE | Freq: Two times a day (BID) | ORAL | 0 refills | Status: AC

## 2016-08-30 MED ORDER — METRONIDAZOLE 500 MG PO TABS
500.0000 mg | ORAL_TABLET | Freq: Two times a day (BID) | ORAL | 0 refills | Status: DC
Start: 2016-08-30 — End: 2017-03-12

## 2016-08-30 NOTE — Telephone Encounter (Signed)
Left message for pt to return call at earliest convenience. Need to let pt know that Provider, Fabian NovemberM. Bhambri, CNM sent in rx for BV to pharmacy.

## 2016-08-30 NOTE — MAU Provider Note (Signed)
History     CSN: [id number]  Arrival date and time: 08/30/16 1317   None     Chief Complaint  Patient presents with  . Dysuria   Non-pregnant female here with vaginal pain and pressure since yesterday. Pain is worse with urination. Rates 4/10. She endorses polyuria and urgency. No hematuria. Voiding only small amounts. She also reports green vaginal discharge x2 days. No odor or itching. No new partner in 1 year but reports spouse is a Naval architect and would like partial STD check.     OB History    Gravida Para Term Preterm AB Living   5 3 3  0 2 3   SAB TAB Ectopic Multiple Live Births   2 0 0 0 3      Past Medical History:  Diagnosis Date  . Anemia   . Anxiety   . Constipation   . GERD (gastroesophageal reflux disease)   . Gonorrhea     Past Surgical History:  Procedure Laterality Date  . WISDOM TOOTH EXTRACTION      No family history on file.  Social History  Substance Use Topics  . Smoking status: Never Smoker  . Smokeless tobacco: Never Used  . Alcohol use Yes     Comment: occasion  LAST TIME-  04-02-2014    Allergies:  Allergies  Allergen Reactions  . Mushroom Extract Complex Hives  . Other Hives    Pt is allergic to paprika seasoning    Prescriptions Prior to Admission  Medication Sig Dispense Refill Last Dose  . cephALEXin (KEFLEX) 500 MG capsule Take 1 capsule (500 mg total) by mouth 4 (four) times daily. 20 capsule 0   . ondansetron (ZOFRAN) 4 MG tablet Take 1 tablet (4 mg total) by mouth every 8 (eight) hours as needed for nausea or vomiting. 12 tablet 0     Review of Systems  Constitutional: Negative for chills and fever.  Gastrointestinal: Negative for abdominal pain.  Genitourinary: Positive for dysuria, frequency, urgency, vaginal discharge and vaginal pain. Negative for hematuria.   Physical Exam   Blood pressure 112/69, pulse 79, temperature 98 F (36.7 C), temperature source Oral, resp. rate 18, weight 106.5 kg (234 lb 12 oz),  last menstrual period 08/10/2016, SpO2 99 %, not currently breastfeeding.  Physical Exam  Nursing note and vitals reviewed. Constitutional: She is oriented to person, place, and time. She appears well-developed and well-nourished. No distress.  Neck: Normal range of motion.  Cardiovascular: Normal rate.   Respiratory: Effort normal.  GI: Soft. She exhibits no distension and no mass. There is tenderness in the suprapubic area. There is no rebound and no guarding.  Genitourinary:  Genitourinary Comments: External: no lesions or erythema Vagina: rugated, parous, thin yellow discharge Uterus: non enlarged, anteverted, non tender, no CMT Adnexae: no masses, no tenderness left, no tenderness right   Musculoskeletal: Normal range of motion.  Neurological: She is alert and oriented to person, place, and time.  Skin: Skin is warm and dry.  Psychiatric: She has a normal mood and affect.   Results for orders placed or performed during the hospital encounter of 08/30/16 (from the past 24 hour(s))  Urinalysis, Routine w reflex microscopic     Status: Abnormal   Collection Time: 08/30/16  2:08 PM  Result Value Ref Range   Color, Urine YELLOW YELLOW   APPearance HAZY (A) CLEAR   Specific Gravity, Urine 1.019 1.005 - 1.030   pH 7.0 5.0 - 8.0   Glucose, UA NEGATIVE  NEGATIVE mg/dL   Hgb urine dipstick NEGATIVE NEGATIVE   Bilirubin Urine NEGATIVE NEGATIVE   Ketones, ur NEGATIVE NEGATIVE mg/dL   Protein, ur NEGATIVE NEGATIVE mg/dL   Nitrite NEGATIVE NEGATIVE   Leukocytes, UA LARGE (A) NEGATIVE   RBC / HPF 0-5 0 - 5 RBC/hpf   WBC, UA TOO NUMEROUS TO COUNT 0 - 5 WBC/hpf   Bacteria, UA RARE (A) NONE SEEN   Squamous Epithelial / LPF 0-5 (A) NONE SEEN   Mucous PRESENT   Pregnancy, urine POC     Status: None   Collection Time: 08/30/16  2:26 PM  Result Value Ref Range   Preg Test, Ur NEGATIVE NEGATIVE  Wet prep, genital     Status: Abnormal   Collection Time: 08/30/16  2:35 PM  Result Value Ref  Range   Yeast Wet Prep HPF POC NONE SEEN NONE SEEN   Trich, Wet Prep NONE SEEN NONE SEEN   Clue Cells Wet Prep HPF POC PRESENT (A) NONE SEEN   WBC, Wet Prep HPF POC MANY (A) NONE SEEN   Sperm NONE SEEN     MAU Course  Procedures  MDM Labs ordered and reviewed. Will treat UTI and BV. UC sent. Stable for discharge home.   Assessment and Plan   1. Acute cystitis without hematuria   2. Vaginal discharge    Discharge home Rx Flagyl Rx Macrobid Abstain until sx resolved  Allergies as of 08/30/2016      Reactions   Mushroom Extract Complex Hives   Other Hives   Pt is allergic to paprika seasoning      Medication List    STOP taking these medications   cephALEXin 500 MG capsule Commonly known as:  KEFLEX   ondansetron 4 MG tablet Commonly known as:  ZOFRAN     TAKE these medications   metroNIDAZOLE 500 MG tablet Commonly known as:  FLAGYL Take 1 tablet (500 mg total) by mouth 2 (two) times daily.   nitrofurantoin (macrocrystal-monohydrate) 100 MG capsule Commonly known as:  MACROBID Take 1 capsule (100 mg total) by mouth 2 (two) times daily.       Donette LarryMelanie Hanley Rispoli, CNM 08/30/2016, 3:00 PM

## 2016-08-30 NOTE — Discharge Instructions (Signed)

## 2016-08-30 NOTE — MAU Note (Signed)
Having like really, really bad in vagina area.  Hurts so bad when she goes to the bathroom.  Doesn't burn.  Frequency and urge to pee, small amts and pressure/pain after she goes. Pt restless.

## 2016-08-31 LAB — GC/CHLAMYDIA PROBE AMP (~~LOC~~) NOT AT ARMC
CHLAMYDIA, DNA PROBE: NEGATIVE
Neisseria Gonorrhea: NEGATIVE

## 2016-08-31 LAB — URINE CULTURE

## 2016-09-01 ENCOUNTER — Telehealth (HOSPITAL_COMMUNITY): Payer: Self-pay

## 2016-09-01 NOTE — Telephone Encounter (Signed)
Pt called back. Verified name and DOB. Notified pt that the provider sent 2 rx to pharmacy. Pt stated that she had already picked them up and has started taking them. "Because if she sent them, then i'm assuming I needed to take them."

## 2016-09-01 NOTE — Telephone Encounter (Signed)
Tried to call pt back but was sent to voicemail. Want to inform pt that provider sent 2 prescriptions to her pharmacy. Left message for her to call back at earliest convenience.

## 2016-09-01 NOTE — Telephone Encounter (Signed)
Pt was returning a call. Said someone left her a message to call back.

## 2016-11-21 ENCOUNTER — Emergency Department (HOSPITAL_COMMUNITY)
Admission: EM | Admit: 2016-11-21 | Discharge: 2016-11-21 | Disposition: A | Discharge status: Home / Self Care | Payer: Medicaid Other | Attending: Emergency Medicine | Admitting: Emergency Medicine

## 2016-11-21 ENCOUNTER — Encounter (HOSPITAL_COMMUNITY): Payer: Self-pay | Admitting: Emergency Medicine

## 2016-11-21 DIAGNOSIS — J069 Acute upper respiratory infection, unspecified: Secondary | ICD-10-CM

## 2016-11-21 DIAGNOSIS — J029 Acute pharyngitis, unspecified: Secondary | ICD-10-CM | POA: Diagnosis present

## 2016-11-21 MED ORDER — BENZONATATE 100 MG PO CAPS: 100.0000 mg | ORAL_CAPSULE | Freq: Three times a day (TID) | ORAL | 0 refills | Status: DC

## 2016-11-21 MED ORDER — FLUTICASONE PROPIONATE 50 MCG/ACT NA SUSP: 1.0000 | Freq: Every day | NASAL | 0 refills | Status: DC

## 2016-11-21 NOTE — Discharge Instructions (Signed)
It was my pleasure taking care of you today!   Your symptoms are likely due to a viral upper respiratory infection. Fortunately, we did not see evidence of serious infection and can treat your symptoms. Please start staking either Claritin or Zyrtec daily. Flonase for nasal congestion, tessalon as needed for cough. Alternate between Tylenol and ibuprofen as needed for body aches.  Rest, drink plenty of fluids to be sure you are staying hydrated.   If the left ear starts to hurt, please see your primary care, urgent care or return to ER to have ear re-checked. Please follow up with your primary doctor for discussion of your diagnoses and further evaluation after today's visit if symptoms persist longer than 7 days; Return to the ER for high fevers, difficulty breathing or other concerning symptoms

## 2016-11-21 NOTE — ED Provider Notes (Signed)
[facility]-EMERGENCY DEPT Provider Note   CSN: [id number] Arrival date & time: 11/21/16  1927 By signing my name below, I, Jaclyn Tyler, attest that this documentation has been prepared under the direction and in the presence of non-physician practitioner, Shanna CiscoJamie Tran Arzuaga, PA-C. Electronically Signed: Levon HedgerElizabeth Tyler, Scribe. 11/21/2016. 9:06 PM.   History   Chief Complaint Chief Complaint  Patient presents with  . Sore Throat  . Cough  . Otalgia   HPI Jaclyn Laenny Madonna is a 26 y.o. female who presents to the Emergency Department complaining of persistent sinus congestion onset about one week ago. She reports associated non-productive cough, chest pain, rhinorrhea, and decreased hearing to left ear. No alleviating or modifying factors noted. No OTC treatments tried for these symptoms PTA.  Pt states her daughter has a URI and has similar symptoms. She denies any rash, lesions, color changes, sore throat or otalgia. No alleviating or modifying factors noted.    The history is provided by the patient. No language interpreter was used.    Past Medical History:  Diagnosis Date  . Anemia   . Anxiety   . Constipation   . GERD (gastroesophageal reflux disease)   . Gonorrhea     Patient Active Problem List   Diagnosis Date Noted  . Pregnancy 12/17/2014  . NVD (normal vaginal delivery) 12/17/2014  . [redacted] weeks gestation of pregnancy   . Abdominal pain in pregnancy, antepartum     Past Surgical History:  Procedure Laterality Date  . WISDOM TOOTH EXTRACTION      OB History    Gravida Para Term Preterm AB Living   5 3 3  0 2 3   SAB TAB Ectopic Multiple Live Births   2 0 0 0 3       Home Medications    Prior to Admission medications   Medication Sig Start Date End Date Taking? Authorizing Provider  benzonatate (TESSALON) 100 MG capsule Take 1 capsule (100 mg total) by mouth every 8 (eight) hours. 11/21/16   Audreana Hancox, Chase PicketJaime Pilcher, PA-C  fluticasone (FLONASE) 50 MCG/ACT nasal spray Place 1  spray into both nostrils daily. 11/21/16   Urania Pearlman, Chase PicketJaime Pilcher, PA-C  metroNIDAZOLE (FLAGYL) 500 MG tablet Take 1 tablet (500 mg total) by mouth 2 (two) times daily. 08/30/16   Donette LarryBhambri, Melanie, CNM    Family History History reviewed. No pertinent family history.  Social History Social History  Substance Use Topics  . Smoking status: Never Smoker  . Smokeless tobacco: Never Used  . Alcohol use Yes     Comment: occasion  LAST TIME-  04-02-2014     Allergies   Mushroom extract complex and Other   Review of Systems Review of Systems  HENT: Positive for congestion, hearing loss and rhinorrhea. Negative for ear pain and sore throat.   Respiratory: Positive for cough.   Cardiovascular: Positive for chest pain.  Musculoskeletal: Positive for myalgias.  Skin: Negative for color change, rash and wound.    Physical Exam Updated Vital Signs BP 123/78 (BP Location: Right Arm)   Pulse 98   Temp 98.6 F (37 C) (Oral)   Resp 14   Ht 6\' 1"  (1.854 m)   Wt 100.7 kg (221 lb 14.4 oz)   LMP 10/28/2016   SpO2 100%   BMI 29.28 kg/m   Physical Exam  Constitutional: She is oriented to person, place, and time. She appears well-developed and well-nourished. No distress.  HENT:  Head: Normocephalic and atraumatic.  Right Ear: Tympanic membrane normal.  OP  with erythema, no exudates or tonsillar hypertrophy. + nasal congestion with mucosal edema. Fluid behind left ear but no infection.  Neck: Normal range of motion. Neck supple.  No meningeal signs.   Cardiovascular: Normal rate, regular rhythm and normal heart sounds.   Pulmonary/Chest: Effort normal.  Lungs are clear to auscultation bilaterally - no w/r/r  Abdominal: Soft. She exhibits no distension. There is no tenderness.  Musculoskeletal: Normal range of motion.  Neurological: She is alert and oriented to person, place, and time.  Skin: Skin is warm and dry. She is not diaphoretic.  Nursing note and vitals reviewed.  ED Treatments /  Results  DIAGNOSTIC STUDIES:  Oxygen Saturation is 100% on RA, normal by my interpretation.    COORDINATION OF CARE:  9:05 PM Discussed treatment plan which includes allergy medication and nasal spraywith pt at bedside and pt agreed to plan.   Labs (all labs ordered are listed, but only abnormal results are displayed) Labs Reviewed - No data to display  EKG  EKG Interpretation None       Radiology No results found.  Procedures Procedures (including critical care time)  Medications Ordered in ED Medications - No data to display   Initial Impression / Assessment and Plan / ED Course  I have reviewed the triage vital signs and the nursing notes.  Pertinent labs & imaging results that were available during my care of the patient were reviewed by me and considered in my medical decision making (see chart for details).     Jaclyn Tyler is a 26 y.o. female who presents to ED for cough, congestion and decreased hearing in left ear.   On exam, patient is afebrile, non-toxic appearing with a clear lung exam. Mild rhinorrhea and OP with erythema but no exudates or tonsillar hypertrophy. Fluid behind left ear but no active infection.  Sxs today likely due to viral URI.Symptomatic home care instructions discussed. Patient with fair amount of fluid behind left ear. Just decreased hearing, no pain. Discussed that while ear is not infected at this time, it could become infected in the future. Follow up if ear pain develops. Will start her on zyrtec and flonase. Rx for tessalon given as well. PCP follow up strongly encouraged if symptoms persist. Reasons to return to ER discussed. All questions answered.   Blood pressure 123/78, pulse 98, temperature 98.6 F (37 C), temperature source Oral, resp. rate 14, height 6\' 1"  (1.854 m), weight 100.7 kg (221 lb 14.4 oz), last menstrual period 10/28/2016, SpO2 100 %, not currently breastfeeding.   Final Clinical Impressions(s) / ED Diagnoses     Final diagnoses:  Viral URI    New Prescriptions Discharge Medication List as of 11/21/2016  9:08 PM    START taking these medications   Details  benzonatate (TESSALON) 100 MG capsule Take 1 capsule (100 mg total) by mouth every 8 (eight) hours., Starting Sun 11/21/2016, Print    fluticasone (FLONASE) 50 MCG/ACT nasal spray Place 1 spray into both nostrils daily., Starting Sun 11/21/2016, Print        I personally performed the services described in this documentation, which was scribed in my presence. The recorded information has been reviewed and is accurate.    Terrin Imparato, Chase PicketJaime Pilcher, PA-C 11/22/16 0002    Maia PlanLong, Joshua G, MD 11/24/16 [phone]

## 2016-12-02 ENCOUNTER — Emergency Department (HOSPITAL_COMMUNITY)
Admission: EM | Admit: 2016-12-02 | Discharge: 2016-12-02 | Disposition: A | Discharge status: Home / Self Care | Payer: Medicaid Other | Attending: Emergency Medicine | Admitting: Emergency Medicine

## 2016-12-02 ENCOUNTER — Encounter (HOSPITAL_COMMUNITY): Payer: Self-pay | Admitting: Emergency Medicine

## 2016-12-02 DIAGNOSIS — J020 Streptococcal pharyngitis: Secondary | ICD-10-CM | POA: Diagnosis not present

## 2016-12-02 DIAGNOSIS — Z79899 Other long term (current) drug therapy: Secondary | ICD-10-CM | POA: Diagnosis not present

## 2016-12-02 DIAGNOSIS — J029 Acute pharyngitis, unspecified: Secondary | ICD-10-CM | POA: Diagnosis present

## 2016-12-02 LAB — RAPID STREP SCREEN (MED CTR MEBANE ONLY): Streptococcus, Group A Screen (Direct): POSITIVE — AB

## 2016-12-02 MED ORDER — AMOXICILLIN-POT CLAVULANATE 875-125 MG PO TABS: 1.0000 | ORAL_TABLET | Freq: Two times a day (BID) | ORAL | 0 refills | Status: DC

## 2016-12-02 NOTE — ED Provider Notes (Signed)
[facility]-EMERGENCY DEPT Provider Note   CSN: [id number] Arrival date & time: 12/02/16  1749  By signing my name below, I, Rosario Adie, attest that this documentation has been prepared under the direction and in the presence of [place], PA-C.  Electronically Signed: Rosario Adie, ED Scribe. 12/02/16. 8:31 PM.  History   Chief Complaint Chief Complaint  Patient presents with  . Sore Throat   The history is provided by the patient and medical records. No language interpreter was used.    HPI Comments: Jaclyn Tyler is a 26 y.o. female w/ a h/o anemia, GERD, who presents to the Emergency Department complaining of persistent sore throat x 2 days. Daughter at home recently treated for sore throat as well. Associated with productive cough. No fever or chills. No meds prior to arrival for symptoms.   Past Medical History:  Diagnosis Date  . Anemia   . Anxiety   . Constipation   . GERD (gastroesophageal reflux disease)   . Gonorrhea    Patient Active Problem List   Diagnosis Date Noted  . Pregnancy 12/17/2014  . NVD (normal vaginal delivery) 12/17/2014  . [redacted] weeks gestation of pregnancy   . Abdominal pain in pregnancy, antepartum    Past Surgical History:  Procedure Laterality Date  . WISDOM TOOTH EXTRACTION     OB History    Gravida Para Term Preterm AB Living   5 3 3  0 2 3   SAB TAB Ectopic Multiple Live Births   2 0 0 0 3     Home Medications    Prior to Admission medications   Medication Sig Start Date End Date Taking? Authorizing Provider  amoxicillin-clavulanate (AUGMENTIN) 875-125 MG tablet Take 1 tablet by mouth every 12 (twelve) hours. 12/02/16   Eliyanah Elgersma, Chase Picket, PA-C  benzonatate (TESSALON) 100 MG capsule Take 1 capsule (100 mg total) by mouth every 8 (eight) hours. 11/21/16   Jaxzen Vanhorn, Chase Picket, PA-C  fluticasone (FLONASE) 50 MCG/ACT nasal spray Place 1 spray into both nostrils daily. 11/21/16   Subrina Vecchiarelli, Chase Picket, PA-C  metroNIDAZOLE (FLAGYL)  500 MG tablet Take 1 tablet (500 mg total) by mouth 2 (two) times daily. 08/30/16   Donette Larry, CNM   Family History No family history on file.  Social History Social History  Substance Use Topics  . Smoking status: Never Smoker  . Smokeless tobacco: Never Used  . Alcohol use Yes     Comment: occasion  LAST TIME-  04-02-2014   Allergies   Mushroom extract complex and Other  Review of Systems Review of Systems  Constitutional: Negative for chills and fever.  HENT: Positive for sore throat. Negative for voice change.   Respiratory: Negative for cough and shortness of breath.    Physical Exam Updated Vital Signs BP 122/73   Pulse 84   Temp 98.4 F (36.9 C) (Oral)   Resp 18   Ht 6\' 1"  (1.854 m)   Wt 102.1 kg (225 lb)   LMP 12/01/2016   SpO2 99%   BMI 29.69 kg/m   Physical Exam  Constitutional: She appears well-developed and well-nourished. No distress.  HENT:  Head: Normocephalic and atraumatic.  OP with erythema and exudates. Minimal tonsillar hypertrophy.  Neck: Neck supple.  Cardiovascular: Normal rate, regular rhythm and normal heart sounds.   No murmur heard. Pulmonary/Chest: Effort normal and breath sounds normal. No respiratory distress. She has no wheezes. She has no rales.  Musculoskeletal: Normal range of motion.  Lymphadenopathy:  She has cervical adenopathy.  Neurological: She is alert.  Skin: Skin is warm and dry.  Nursing note and vitals reviewed.  ED Treatments / Results  DIAGNOSTIC STUDIES: Oxygen Saturation is 98% on RA, normal by my interpretation.   COORDINATION OF CARE: 8:31 PM-Discussed next steps with pt. Pt verbalized understanding and is agreeable with the plan.   Labs (all labs ordered are listed, but only abnormal results are displayed) Labs Reviewed  RAPID STREP SCREEN (NOT AT [facility]) - Abnormal; Notable for the following:       Result Value   Streptococcus, Group A Screen (Direct) POSITIVE (*)    All other components within  normal limits   EKG  EKG Interpretation None      Radiology No results found.  Procedures Procedures   Medications Ordered in ED Medications - No data to display  Initial Impression / Assessment and Plan / ED Course  I have reviewed the triage vital signs and the nursing notes.  Pertinent labs & imaging results that were available during my care of the patient were reviewed by me and considered in my medical decision making (see chart for details).    Jaclyn Tyler is a 26 y.o. female who presents to ED for sore throat. On exam, patient is afebrile with tonsillar exudates. Rapid strep positive. Presentation not concerning for PTA or infection spread to soft tissue. No trismus or uvula deviation.Rx for Augmentin given. Specific return precautions discussed. Patient able to drink water in ED without difficulty with intact air way. Discussed importance of hydration. Recommended PCP follow up. All questions answered.   Final Clinical Impressions(s) / ED Diagnoses   Final diagnoses:  Strep throat   New Prescriptions New Prescriptions   AMOXICILLIN-CLAVULANATE (AUGMENTIN) 875-125 MG TABLET    Take 1 tablet by mouth every 12 (twelve) hours.   I personally performed the services described in this documentation, which was scribed in my presence. The recorded information has been reviewed and is accurate.     Louie Flenner, Chase PicketJaime Pilcher, PA-C 12/02/16 2031    Nira Connardama, Pedro Eduardo, MD 12/03/16 [phone]

## 2016-12-02 NOTE — Discharge Instructions (Signed)
You have strep throat or pharyngitis.  Please take all of your antibiotics until finished!  Discard your toothbrush and begin using a new one in 3 days.  For sore throat, take ibuprofen every 6 hours as needed.  Follow up with your doctor in 2-3 days if no improvement.  Return to the ED sooner for worsening condition, inability to swallow, breathing difficulty, new concerns.

## 2016-12-02 NOTE — ED Triage Notes (Signed)
Pt from home with c/o sore throat x 2 days. Pt states her daughter was recently treated for strep throat. Pt is afebrile at time of assessment

## 2017-03-12 ENCOUNTER — Inpatient Hospital Stay (HOSPITAL_COMMUNITY)
Admission: AD | Admit: 2017-03-12 | Discharge: 2017-03-12 | Disposition: A | Discharge status: Home / Self Care | Payer: BLUE CROSS/BLUE SHIELD | Source: Ambulatory Visit | Attending: Obstetrics and Gynecology | Admitting: Obstetrics and Gynecology

## 2017-03-12 ENCOUNTER — Other Ambulatory Visit: Payer: Self-pay

## 2017-03-12 ENCOUNTER — Inpatient Hospital Stay (HOSPITAL_COMMUNITY): Payer: BLUE CROSS/BLUE SHIELD

## 2017-03-12 ENCOUNTER — Encounter (HOSPITAL_COMMUNITY): Payer: Self-pay

## 2017-03-12 DIAGNOSIS — F419 Anxiety disorder, unspecified: Secondary | ICD-10-CM | POA: Insufficient documentation

## 2017-03-12 DIAGNOSIS — O26891 Other specified pregnancy related conditions, first trimester: Secondary | ICD-10-CM | POA: Insufficient documentation

## 2017-03-12 DIAGNOSIS — Z9889 Other specified postprocedural states: Secondary | ICD-10-CM | POA: Diagnosis not present

## 2017-03-12 DIAGNOSIS — O99341 Other mental disorders complicating pregnancy, first trimester: Secondary | ICD-10-CM | POA: Insufficient documentation

## 2017-03-12 DIAGNOSIS — K219 Gastro-esophageal reflux disease without esophagitis: Secondary | ICD-10-CM | POA: Diagnosis not present

## 2017-03-12 DIAGNOSIS — Z3A01 Less than 8 weeks gestation of pregnancy: Secondary | ICD-10-CM | POA: Diagnosis not present

## 2017-03-12 DIAGNOSIS — R109 Unspecified abdominal pain: Secondary | ICD-10-CM | POA: Diagnosis not present

## 2017-03-12 DIAGNOSIS — R102 Pelvic and perineal pain: Secondary | ICD-10-CM

## 2017-03-12 DIAGNOSIS — O99611 Diseases of the digestive system complicating pregnancy, first trimester: Secondary | ICD-10-CM | POA: Diagnosis not present

## 2017-03-12 LAB — URINALYSIS, ROUTINE W REFLEX MICROSCOPIC
Bacteria, UA: NONE SEEN
Bilirubin Urine: NEGATIVE
Glucose, UA: NEGATIVE mg/dL
Hgb urine dipstick: NEGATIVE
KETONES UR: NEGATIVE mg/dL
Nitrite: NEGATIVE
Protein, ur: NEGATIVE mg/dL
Specific Gravity, Urine: 1.017 (ref 1.005–1.030)
pH: 6 (ref 5.0–8.0)

## 2017-03-12 LAB — WET PREP, GENITAL
Sperm: NONE SEEN
TRICH WET PREP: NONE SEEN
YEAST WET PREP: NONE SEEN

## 2017-03-12 LAB — HCG, QUANTITATIVE, PREGNANCY: HCG, BETA CHAIN, QUANT, S: 1806 m[IU]/mL — AB (ref <5)

## 2017-03-12 NOTE — Discharge Instructions (Signed)
Abdominal Pain During Pregnancy °Abdominal pain is common in pregnancy. Most of the time, it does not cause harm. There are many causes of abdominal pain. Some causes are more serious than others and sometimes the cause is not known. Abdominal pain can be a sign that something is very wrong with the pregnancy or the pain may have nothing to do with the pregnancy. Always tell your health care provider if you have any abdominal pain. °Follow these instructions at home: °· Do not have sex or put anything in your vagina until your symptoms go away completely. °· Watch your abdominal pain for any changes. °· Get plenty of rest until your pain improves. °· Drink enough fluid to keep your urine clear or pale yellow. °· Take over-the-counter or prescription medicines only as told by your health care provider. °· Keep all follow-up visits as told by your health care provider. This is important. °Contact a health care provider if: °· You have a fever. °· Your pain gets worse or you have cramping. °· Your pain continues after resting. °Get help right away if: °· You are bleeding, leaking fluid, or passing tissue from the vagina. °· You have vomiting or diarrhea that does not go away. °· You have painful or bloody urination. °· You notice a decrease in your baby's movements. °· You feel very weak or faint. °· You have shortness of breath. °· You develop a severe headache with abdominal pain. °· You have abnormal vaginal discharge with abdominal pain. °This information is not intended to replace advice given to you by your health care provider. Make sure you discuss any questions you have with your health care provider. °Document Released: 04/12/2005 Document Revised: 01/22/2016 Document Reviewed: 11/09/2012 °Elsevier Interactive Patient Education © 2018 [employer] ° °First Trimester of Pregnancy °The first trimester of pregnancy is from week 1 until the end of week 13 (months 1 through 3). A week after a sperm fertilizes an  egg, the egg will implant on the wall of the uterus. This embryo will begin to develop into a baby. Genes from you and your partner will form the baby. The female genes will determine whether the baby will be a boy or a girl. At 6-8 weeks, the eyes and face will be formed, and the heartbeat can be seen on ultrasound. At the end of 12 weeks, all the baby's organs will be formed. °Now that you are pregnant, you will want to do everything you can to have a healthy baby. Two of the most important things are to get good prenatal care and to follow your health care provider's instructions. Prenatal care is all the medical care you receive before the baby's birth. This care will help prevent, find, and treat any problems during the pregnancy and childbirth. °Body changes during your first trimester °Your body goes through many changes during pregnancy. The changes vary from woman to woman. °· You may gain or lose a couple of pounds at first. °· You may feel sick to your stomach (nauseous) and you may throw up (vomit). If the vomiting is uncontrollable, call your health care provider. °· You may tire easily. °· You may develop headaches that can be relieved by medicines. All medicines should be approved by your health care provider. °· You may urinate more often. Painful urination may mean you have a bladder infection. °· You may develop heartburn as a result of your pregnancy. °· You may develop constipation because certain hormones are causing the muscles   that push stool through your intestines to slow down. °· You may develop hemorrhoids or swollen veins (varicose veins). °· Your breasts may begin to grow larger and become tender. Your nipples may stick out more, and the tissue that surrounds them (areola) may become darker. °· Your gums may bleed and may be sensitive to brushing and flossing. °· Dark spots or blotches (chloasma, mask of pregnancy) may develop on your face. This will likely fade after the baby is  born. °· Your menstrual periods will stop. °· You may have a loss of appetite. °· You may develop cravings for certain kinds of food. °· You may have changes in your emotions from day to day, such as being excited to be pregnant or being concerned that something may go wrong with the pregnancy and baby. °· You may have more vivid and strange dreams. °· You may have changes in your hair. These can include thickening of your hair, rapid growth, and changes in texture. Some women also have hair loss during or after pregnancy, or hair that feels dry or thin. Your hair will most likely return to normal after your baby is born. ° °What to expect at prenatal visits °During a routine prenatal visit: °· You will be weighed to make sure you and the baby are growing normally. °· Your blood pressure will be taken. °· Your abdomen will be measured to track your baby's growth. °· The fetal heartbeat will be listened to between weeks 10 and 14 of your pregnancy. °· Test results from any previous visits will be discussed. ° °Your health care provider may ask you: °· How you are feeling. °· If you are feeling the baby move. °· If you have had any abnormal symptoms, such as leaking fluid, bleeding, severe headaches, or abdominal cramping. °· If you are using any tobacco products, including cigarettes, chewing tobacco, and electronic cigarettes. °· If you have any questions. ° °Other tests that may be performed during your first trimester include: °· Blood tests to find your blood type and to check for the presence of any previous infections. The tests will also be used to check for low iron levels (anemia) and protein on red blood cells (Rh antibodies). Depending on your risk factors, or if you previously had diabetes during pregnancy, you may have tests to check for high blood sugar that affects pregnant women (gestational diabetes). °· Urine tests to check for infections, diabetes, or protein in the urine. °· An ultrasound to  confirm the proper growth and development of the baby. °· Fetal screens for spinal cord problems (spina bifida) and Down syndrome. °· HIV (human immunodeficiency virus) testing. Routine prenatal testing includes screening for HIV, unless you choose not to have this test. °· You may need other tests to make sure you and the baby are doing well. ° °Follow these instructions at home: °Medicines °· Follow your health care provider's instructions regarding medicine use. Specific medicines may be either safe or unsafe to take during pregnancy. °· Take a prenatal vitamin that contains at least 600 micrograms (mcg) of folic acid. °· If you develop constipation, try taking a stool softener if your health care provider approves. °Eating and drinking °· Eat a balanced diet that includes fresh fruits and vegetables, whole grains, good sources of protein such as meat, eggs, or tofu, and low-fat dairy. Your health care provider will help you determine the amount of weight gain that is right for you. °· Avoid raw meat and uncooked cheese.   These carry germs that can cause birth defects in the baby. °· Eating four or five small meals rather than three large meals a day may help relieve nausea and vomiting. If you start to feel nauseous, eating a few soda crackers can be helpful. Drinking liquids between meals, instead of during meals, also seems to help ease nausea and vomiting. °· Limit foods that are high in fat and processed sugars, such as fried and sweet foods. °· To prevent constipation: °? Eat foods that are high in fiber, such as fresh fruits and vegetables, whole grains, and beans. °? Drink enough fluid to keep your urine clear or pale yellow. °Activity °· Exercise only as directed by your health care provider. Most women can continue their usual exercise routine during pregnancy. Try to exercise for 30 minutes at least 5 days a week. Exercising will help you: °? Control your weight. °? Stay in shape. °? Be prepared for  labor and delivery. °· Experiencing pain or cramping in the lower abdomen or lower back is a good sign that you should stop exercising. Check with your health care provider before continuing with normal exercises. °· Try to avoid standing for long periods of time. Move your legs often if you must stand in one place for a long time. °· Avoid heavy lifting. °· Wear low-heeled shoes and practice good posture. °· You may continue to have sex unless your health care provider tells you not to. °Relieving pain and discomfort °· Wear a good support bra to relieve breast tenderness. °· Take warm sitz baths to soothe any pain or discomfort caused by hemorrhoids. Use hemorrhoid cream if your health care provider approves. °· Rest with your legs elevated if you have leg cramps or low back pain. °· If you develop varicose veins in your legs, wear support hose. Elevate your feet for 15 minutes, 3-4 times a day. Limit salt in your diet. °Prenatal care °· Schedule your prenatal visits by the twelfth week of pregnancy. They are usually scheduled monthly at first, then more often in the last 2 months before delivery. °· Write down your questions. Take them to your prenatal visits. °· Keep all your prenatal visits as told by your health care provider. This is important. °Safety °· Wear your seat belt at all times when driving. °· Make a list of emergency phone numbers, including numbers for family, friends, the hospital, and police and fire departments. °General instructions °· Ask your health care provider for a referral to a local prenatal education class. Begin classes no later than the beginning of month 6 of your pregnancy. °· Ask for help if you have counseling or nutritional needs during pregnancy. Your health care provider can offer advice or refer you to specialists for help with various needs. °· Do not use hot tubs, steam rooms, or saunas. °· Do not douche or use tampons or scented sanitary pads. °· Do not cross your legs  for long periods of time. °· Avoid cat litter boxes and soil used by cats. These carry germs that can cause birth defects in the baby and possibly loss of the fetus by miscarriage or stillbirth. °· Avoid all smoking, herbs, alcohol, and medicines not prescribed by your health care provider. Chemicals in these products affect the formation and growth of the baby. °· Do not use any products that contain nicotine or tobacco, such as cigarettes and e-cigarettes. If you need help quitting, ask your health care provider. You may receive counseling support and   other resources to help you quit. °· Schedule a dentist appointment. At home, brush your teeth with a soft toothbrush and be gentle when you floss. °Contact a health care provider if: °· You have dizziness. °· You have mild pelvic cramps, pelvic pressure, or nagging pain in the abdominal area. °· You have persistent nausea, vomiting, or diarrhea. °· You have a bad smelling vaginal discharge. °· You have pain when you urinate. °· You notice increased swelling in your face, hands, legs, or ankles. °· You are exposed to fifth disease or chickenpox. °· You are exposed to German measles (rubella) and have never had it. °Get help right away if: °· You have a fever. °· You are leaking fluid from your vagina. °· You have spotting or bleeding from your vagina. °· You have severe abdominal cramping or pain. °· You have rapid weight gain or loss. °· You vomit blood or material that looks like coffee grounds. °· You develop a severe headache. °· You have shortness of breath. °· You have any kind of trauma, such as from a fall or a car accident. °Summary °· The first trimester of pregnancy is from week 1 until the end of week 13 (months 1 through 3). °· Your body goes through many changes during pregnancy. The changes vary from woman to woman. °· You will have routine prenatal visits. During those visits, your health care provider will examine you, discuss any test results you  may have, and talk with you about how you are feeling. °This information is not intended to replace advice given to you by your health care provider. Make sure you discuss any questions you have with your health care provider. °Document Released: 04/06/2001 Document Revised: 03/24/2016 Document Reviewed: 03/24/2016 °Elsevier Interactive Patient Education © 2017 [employer] Inc. ° °

## 2017-03-12 NOTE — Progress Notes (Addendum)
G5P3 @ [redacted]wksga. Presents to triage for "think I'm having a miscarriage". Started cramping this morning.   Last monthly period was Oct 5.   1120: Provider at bs assessing.  1125:  Blind swab of wet prep and GC done.   To U/S via ambulatory  1319: Discharge instructions given with pt understanding. Pt left unit via ambulatory with SO.

## 2017-03-12 NOTE — MAU Provider Note (Signed)
History   [id number] @ apx 5 wks by her guess not sure of LMP. In with abd pain that started this morning. Denies vag bleeding. States pain is in lower middle abd.  CSN: [id number]  Arrival date & time 03/12/17  1054   None     Chief Complaint  Patient presents with  . Abdominal Pain    HPI  Past Medical History:  Diagnosis Date  . Anemia   . Anxiety   . Constipation   . GERD (gastroesophageal reflux disease)   . Gonorrhea     Past Surgical History:  Procedure Laterality Date  . WISDOM TOOTH EXTRACTION      No family history on file.  Social History   Tobacco Use  . Smoking status: Never Smoker  . Smokeless tobacco: Never Used  Substance Use Topics  . Alcohol use: Yes    Comment: occasion  LAST TIME-  04-02-2014  . Drug use: No    OB History    Gravida Para Term Preterm AB Living   5 3 3  0 2 3   SAB TAB Ectopic Multiple Live Births   2 0 0 0 3      Review of Systems  Constitutional: Negative.   HENT: Negative.   Eyes: Negative.   Respiratory: Negative.   Cardiovascular: Negative.   Gastrointestinal: Positive for abdominal pain.  Endocrine: Negative.   Genitourinary: Negative.   Musculoskeletal: Negative.   Skin: Negative.   Allergic/Immunologic: Negative.   Neurological: Negative.   Hematological: Negative.   Psychiatric/Behavioral: Negative.     Allergies  Mushroom extract complex and Other  Home Medications    There were no vitals taken for this visit.  Physical Exam  Constitutional: She is oriented to person, place, and time. She appears well-developed and well-nourished.  HENT:  Head: Normocephalic.  Eyes: Pupils are equal, round, and reactive to light.  Neck: Normal range of motion.  Cardiovascular: Normal rate, regular rhythm, normal heart sounds and intact distal pulses.  Pulmonary/Chest: Effort normal and breath sounds normal.  Abdominal: Soft. Bowel sounds are normal.  Genitourinary: Vagina normal and uterus normal.   Musculoskeletal: Normal range of motion.  Neurological: She is alert and oriented to person, place, and time. She has normal reflexes.  Skin: Skin is warm and dry.  Psychiatric: She has a normal mood and affect. Her behavior is normal. Judgment and thought content normal.    MAU Course  Procedures (including critical care time)  Labs Reviewed  WET PREP, GENITAL  URINALYSIS, ROUTINE W REFLEX MICROSCOPIC  HCG, QUANTITATIVE, PREGNANCY  GC/CHLAMYDIA PROBE AMP ([place]) NOT AT [facility]   No results found.   1. Abdominal pain in pregnancy, first trimester       MDM  VSS, wet prep neg, cultures pending, us show probable early iup.with yolk sac. Will d/c home

## 2017-03-14 LAB — GC/CHLAMYDIA PROBE AMP (~~LOC~~) NOT AT ARMC
Chlamydia: NEGATIVE
NEISSERIA GONORRHEA: NEGATIVE

## 2017-03-14 LAB — POCT PREGNANCY, URINE: Preg Test, Ur: POSITIVE — AB

## 2017-03-15 ENCOUNTER — Encounter (HOSPITAL_COMMUNITY): Payer: Self-pay | Admitting: *Deleted

## 2017-04-08 ENCOUNTER — Inpatient Hospital Stay (HOSPITAL_COMMUNITY)
Admission: AD | Admit: 2017-04-08 | Discharge: 2017-04-08 | Disposition: A | Discharge status: Home / Self Care | Payer: BLUE CROSS/BLUE SHIELD | Source: Ambulatory Visit | Attending: Family Medicine | Admitting: Family Medicine

## 2017-04-08 ENCOUNTER — Inpatient Hospital Stay (HOSPITAL_COMMUNITY): Payer: BLUE CROSS/BLUE SHIELD

## 2017-04-08 ENCOUNTER — Encounter (HOSPITAL_COMMUNITY): Payer: Self-pay | Admitting: *Deleted

## 2017-04-08 DIAGNOSIS — Z79899 Other long term (current) drug therapy: Secondary | ICD-10-CM | POA: Insufficient documentation

## 2017-04-08 DIAGNOSIS — O039 Complete or unspecified spontaneous abortion without complication: Secondary | ICD-10-CM | POA: Insufficient documentation

## 2017-04-08 DIAGNOSIS — Z91018 Allergy to other foods: Secondary | ICD-10-CM | POA: Diagnosis not present

## 2017-04-08 DIAGNOSIS — K219 Gastro-esophageal reflux disease without esophagitis: Secondary | ICD-10-CM | POA: Insufficient documentation

## 2017-04-08 DIAGNOSIS — R1030 Lower abdominal pain, unspecified: Secondary | ICD-10-CM | POA: Insufficient documentation

## 2017-04-08 DIAGNOSIS — N939 Abnormal uterine and vaginal bleeding, unspecified: Secondary | ICD-10-CM

## 2017-04-08 LAB — CBC
HCT: 30.3 % — ABNORMAL LOW (ref 36.0–46.0)
Hemoglobin: 9.3 g/dL — ABNORMAL LOW (ref 12.0–15.0)
MCH: 21.9 pg — AB (ref 26.0–34.0)
MCHC: 30.7 g/dL (ref 30.0–36.0)
MCV: 71.5 fL — AB (ref 78.0–100.0)
PLATELETS: 342 10*3/uL (ref 150–400)
RBC: 4.24 MIL/uL (ref 3.87–5.11)
RDW: 18.1 % — AB (ref 11.5–15.5)
WBC: 15.1 10*3/uL — ABNORMAL HIGH (ref 4.0–10.5)

## 2017-04-08 LAB — HCG, QUANTITATIVE, PREGNANCY: hCG, Beta Chain, Quant, S: 21174 m[IU]/mL — ABNORMAL HIGH (ref <5)

## 2017-04-08 MED ORDER — IBUPROFEN 800 MG PO TABS: 800.0000 mg | ORAL_TABLET | Freq: Three times a day (TID) | ORAL | 0 refills | Status: DC

## 2017-04-08 NOTE — MAU Note (Signed)
Pt presents via EMS with complaint of onset of bleeding about 2 hours ago, passing clots, lower abd cramping.

## 2017-04-08 NOTE — MAU Provider Note (Signed)
History     CSN: [id number]  Arrival date and time: 04/08/17 1416   First Provider Initiated Contact with Patient 04/08/17 1451     Chief Complaint  Patient presents with  . Vaginal Bleeding  . Abdominal Pain   HPI Jaclyn Tyler is a 26 y.o. [id number] at 2131w0d who presents with heavy vaginal bleeding. She states she started having light bleeding at 0330 that became heavy around 1330. She states she had heavy bleeding and passed several large clots at work. She is also having lower abdominal pain that she rates a 4/10 and has not tried anything for the pain. She feels like the pain is better than earlier but is still having intermittent cramping. She had an u/s on 11/17 that showed a gestational sac and possible yolk sac but the patient has had no more follow up since then.   OB History    Gravida Para Term Preterm AB Living   6 3 3  0 2 3   SAB TAB Ectopic Multiple Live Births   2 0 0 0 3      Past Medical History:  Diagnosis Date  . Anemia   . Constipation   . GERD (gastroesophageal reflux disease)   . Gonorrhea     Past Surgical History:  Procedure Laterality Date  . WISDOM TOOTH EXTRACTION      History reviewed. No pertinent family history.  Social History   Tobacco Use  . Smoking status: Never Smoker  . Smokeless tobacco: Never Used  Substance Use Topics  . Alcohol use: Yes    Comment: occasion  LAST TIME-  04-02-2014  . Drug use: No    Allergies:  Allergies  Allergen Reactions  . Mushroom Extract Complex Hives  . Other Hives    Pt is allergic to paprika seasoning    Medications Prior to Admission  Medication Sig Dispense Refill Last Dose  . Prenatal Vit-Fe Fumarate-FA (PRENATAL MULTIVITAMIN) TABS tablet Take 2 tablets by mouth daily at 12 noon.   04/08/2017 at Unknown time    Review of Systems  Constitutional: Negative.  Negative for fatigue and fever.  HENT: Negative.   Respiratory: Negative.  Negative for shortness of breath.   Cardiovascular:  Negative.  Negative for chest pain.  Gastrointestinal: Positive for abdominal pain. Negative for constipation, diarrhea, nausea and vomiting.  Genitourinary: Positive for vaginal bleeding. Negative for dysuria and vaginal discharge.  Neurological: Negative.  Negative for dizziness and headaches.   Physical Exam   Blood pressure 126/72, pulse 96, temperature 98.4 F (36.9 C), temperature source Oral, resp. rate 18, last menstrual period 12/01/2016, SpO2 99 %.  Physical Exam  Nursing note and vitals reviewed. Constitutional: She is oriented to person, place, and time. She appears well-developed and well-nourished. No distress.  HENT:  Head: Normocephalic.  Eyes: Pupils are equal, round, and reactive to light.  Cardiovascular: Normal rate, regular rhythm and normal heart sounds.  Respiratory: Effort normal and breath sounds normal. No respiratory distress.  GI: Soft. Bowel sounds are normal. She exhibits no distension and no mass. There is no tenderness. There is no guarding.  Genitourinary: There is bleeding in the vagina.  Genitourinary Comments: Small amount of dark red bleeding in vault, small amount of POC removed from cervix.  Neurological: She is alert and oriented to person, place, and time.  Skin: Skin is warm and dry.  Psychiatric: She has a normal mood and affect. Her behavior is normal. Judgment and thought content normal.    MAU  Course  Procedures Results for orders placed or performed during the hospital encounter of 04/08/17 (from the past 24 hour(s))  CBC     Status: Abnormal   Collection Time: 04/08/17  2:48 PM  Result Value Ref Range   WBC 15.1 (H) 4.0 - 10.5 K/uL   RBC 4.24 3.87 - 5.11 MIL/uL   Hemoglobin 9.3 (L) 12.0 - 15.0 g/dL   HCT 46.930.3 (L) 62.936.0 - 52.846.0 %   MCV 71.5 (L) 78.0 - 100.0 fL   MCH 21.9 (L) 26.0 - 34.0 pg   MCHC 30.7 30.0 - 36.0 g/dL   RDW 41.318.1 (H) 24.411.5 - 01.015.5 %   Platelets 342 150 - 400 K/uL  hCG, quantitative, pregnancy     Status: Abnormal    Collection Time: 04/08/17  2:48 PM  Result Value Ref Range   hCG, Beta Chain, Quant, S 21,174 (H) <5 mIU/mL   [place] Ob Transvaginal  Result Date: 04/08/2017 CLINICAL DATA:  Pain, bleeding EXAM: TRANSVAGINAL OB ULTRASOUND TECHNIQUE: Transvaginal ultrasound was performed for complete evaluation of the gestation as well as the maternal uterus, adnexal regions, and pelvic cul-de-sac. COMPARISON:  03/12/2017 FINDINGS: Intrauterine gestational sac: None visualized Yolk sac:  Not visualized Embryo:  Not visualized Cardiac Activity: Heart Rate:  bpm MSD:   mm    w     d CRL:     mm    w  d                  US EDC: Subchorionic hemorrhage:  N/A Maternal uterus/adnexae: No adnexal mass or free fluid. IMPRESSION: No intrauterine gestation noted on today's study concerning for spontaneous abortion. This could be followed with repeat scratched at this could be followed with serial quantitative beta HCGs and ultrasounds as clinically indicated. Electronically Signed   By: Charlett NoseKevin  Dover M.D.   On: 04/08/2017 16:23    MDM CBC HCG US OB Transvaginal- no intrauterine gestational sac or retained POC, consisted with a complete SAB Surgical pathology  Assessment and Plan   1. Complete miscarriage   2. Vaginal bleeding    -Discharge home in stable condition -Rx for ibuprofen sent to patient's pharmacy -Bleeding and abdominal pain precautions discussed -Patient advised to follow-up with [facility] in 2 weeks, message sent -Patient may return to MAU as needed or if her condition were to change or worsen  Rolm BookbinderCaroline M Darnetta Kesselman CNM 04/08/2017, 4:48 PM   Allergies as of 04/08/2017      Reactions   Mushroom Extract Complex Hives   Other Hives   Pt is allergic to paprika seasoning      Medication List    TAKE these medications   ibuprofen 800 MG tablet Commonly known as:  ADVIL,MOTRIN Take 1 tablet (800 mg total) by mouth 3 (three) times daily.   prenatal multivitamin Tabs tablet Take 2 tablets by mouth  daily at 12 noon.

## 2017-04-08 NOTE — Discharge Instructions (Signed)

## 2017-04-26 NOTE — L&D Delivery Note (Addendum)
Patient: Jaclyn Tyler MRN: [id number]  GBS status: neg, IAP given: NA  Patient is a 27 y.o. now [id number] s/p NSVD at 7682w0d, who was admitted for SOL. SROM 2h 4430m prior to delivery with clear fluid.    Delivery Note At 6:25 PM a viable female was delivered via Vaginal, Spontaneous (Presentation: ROA).  APGAR: 9, 9; weight  pending.   Placenta status: intact.  Cord: three vessel, with the following complications: none.    Anesthesia: epidural Episiotomy: None Lacerations: right periurethral Suture Repair: NA Est. Blood Loss (mL): 200  Mom to postpartum.  Baby to Couplet care / Skin to Skin.  [name] 04/22/2018, 6:45 PM     Head delivered ROA. No nuchal cord present. Shoulder and body delivered in usual fashion. Infant with spontaneous cry, placed on mother's abdomen, dried and bulb suctioned. Cord clamped x 2 after 1-minute delay, and cut by family member. Cord blood drawn. Placenta delivered spontaneously with gentle cord traction, and trailing membranes were removed with ring forceps. Fundus firm with massage and Pitocin. Perineum inspected and found to have right periurethral laceration, which was found to be hemostatic.   OB FELLOW DELIVERY ATTESTATION  I was gloved and present for the delivery in its entirety, and I agree with the above resident's note.    Gwenevere AbbotNimeka Fielding Mault, MD   OB Fellow  04/22/2018, 6:50 PM

## 2017-04-27 ENCOUNTER — Encounter: Payer: BLUE CROSS/BLUE SHIELD | Admitting: Student

## 2017-08-02 ENCOUNTER — Encounter (HOSPITAL_COMMUNITY): Payer: Self-pay | Admitting: Emergency Medicine

## 2017-08-02 ENCOUNTER — Emergency Department (HOSPITAL_COMMUNITY): Payer: BLUE CROSS/BLUE SHIELD

## 2017-08-02 ENCOUNTER — Other Ambulatory Visit: Payer: Self-pay

## 2017-08-02 ENCOUNTER — Emergency Department (HOSPITAL_COMMUNITY)
Admission: EM | Admit: 2017-08-02 | Discharge: 2017-08-02 | Disposition: A | Discharge status: Home / Self Care | Payer: BLUE CROSS/BLUE SHIELD | Attending: Emergency Medicine | Admitting: Emergency Medicine

## 2017-08-02 DIAGNOSIS — R0789 Other chest pain: Secondary | ICD-10-CM

## 2017-08-02 DIAGNOSIS — Y999 Unspecified external cause status: Secondary | ICD-10-CM | POA: Diagnosis not present

## 2017-08-02 DIAGNOSIS — M545 Low back pain, unspecified: Secondary | ICD-10-CM

## 2017-08-02 DIAGNOSIS — Y9389 Activity, other specified: Secondary | ICD-10-CM | POA: Diagnosis not present

## 2017-08-02 DIAGNOSIS — Y92410 Unspecified street and highway as the place of occurrence of the external cause: Secondary | ICD-10-CM | POA: Insufficient documentation

## 2017-08-02 DIAGNOSIS — Z79899 Other long term (current) drug therapy: Secondary | ICD-10-CM | POA: Insufficient documentation

## 2017-08-02 MED ORDER — CYCLOBENZAPRINE HCL 10 MG PO TABS: 10.0000 mg | ORAL_TABLET | Freq: Two times a day (BID) | ORAL | 0 refills | Status: DC | PRN
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
## 2017-08-02 NOTE — Discharge Instructions (Signed)
Use the incentive spirometer to avoid development of a pneumonia, do the exercises 2-3 times daily.Alternate 600 mg of ibuprofen and [phone] mg of Tylenol every 3 hours as needed for pain. Do not exceed 4000 mg of Tylenol daily. You may take Flexeril up to twice daily as needed for muscle spasms. This medication may make you drowsy, so I typically only recommended at night. If this medication makes you drowsy throughout the day, no driving, drinking alcohol, or operating heavy machinery. You may also cut these tablets in half. Ice to areas of soreness for the next few days and then may move to heat. Do some gentle stretching throughout the day, especially during hot showers or baths. Take short frequent walks and avoid prolonged periods of sitting or laying. Expect to be sore for the next few day and follow up with primary care physician for recheck of ongoing symptoms but return to ER for emergent changing or worsening of symptoms such as severe headache that gets worse, altered mental status/behaving unusually, persistent vomiting, excessive drowsiness, numbness to the arms or legs, unsteady gait, or slurred speech.

## 2017-11-11 ENCOUNTER — Encounter: Payer: Self-pay | Admitting: Family Medicine

## 2017-11-11 ENCOUNTER — Ambulatory Visit (INDEPENDENT_AMBULATORY_CARE_PROVIDER_SITE_OTHER): Payer: BLUE CROSS/BLUE SHIELD | Admitting: Advanced Practice Midwife

## 2017-11-11 DIAGNOSIS — O283 Abnormal ultrasonic finding on antenatal screening of mother: Secondary | ICD-10-CM

## 2017-11-11 DIAGNOSIS — Z3201 Encounter for pregnancy test, result positive: Secondary | ICD-10-CM

## 2017-11-11 DIAGNOSIS — O3680X0 Pregnancy with inconclusive fetal viability, not applicable or unspecified: Secondary | ICD-10-CM

## 2017-11-11 LAB — POCT PREGNANCY, URINE: PREG TEST UR: POSITIVE — AB

## 2017-11-11 NOTE — Progress Notes (Signed)
   PRENATAL VISIT NOTE  Subjective:  Jaclyn Tyler is a 27 y.o. [phone] at 7924w0d being seen today for ongoing prenatal care. Patient initially scheduled a pregnancy confirmation visit. Positive today in clinic. Patient reports irregular menstrual cycles with LMP of 10/30/2017 with three days of bleeding. States she did not have a menstrual cycle in the month of June, but did have three days of her normal menstrual bleeding in April and May. Reports most recent sexual intercourse was within the past month, no contraception. No complaints today but thinks that she can feel her uterus in the middle of her abdomen and has "gained a bunch of weight". No complaints today. Denies breast tenderness, nausea/vomiting, food/smell aversions. Denies vaginal bleeding, leaking of fluid, fever, falls, or recent illness.     The following portions of the patient's history were reviewed and updated as appropriate: allergies, current medications, past family history, past medical history, past social history, past surgical history and problem list. Problem list updated.  Objective:  There were no vitals filed for this visit.  Fetal Status:           General:  Alert, oriented and cooperative. Patient is in no acute distress.  Skin: Skin is warm and dry. No rash noted.   Cardiovascular: Normal heart rate noted  Respiratory: Normal respiratory effort, no problems with respiration noted  Abdomen: Soft, gravid, appropriate for gestational age.        Pelvic: Cervical exam deferred        Extremities: Normal range of motion.     Mental Status: Normal mood and affect. Normal behavior. Normal judgment and thought content.   Fundus not palpated  Assessment and Plan:  Pregnancy: [id number] at 3824w0d  1. Positive pregnancy test - CBC - ABO AND RH  - B-HCG Quant - Discussed issue of unknown gestational age, location and viability --Encouraged to initiate daily prenatal vitamin with Fe, declined rx  2. Pregnancy of  unknown anatomic location - US OB Comp Less 14 Wks; Future  Preterm labor symptoms and general obstetric precautions including but not limited to vaginal bleeding, contractions, leaking of fluid and fetal movement were reviewed in detail with the patient. Please refer to After Visit Summary for other counseling recommendations.  No follow-ups on file.  Future Appointments  Date Time Provider Department Center  11/18/2017 11:00 AM [facility] 1 [facility] [address]    Samantha C San Rafael[place], [place]  11/11/17  11:06 AM

## 2017-11-11 NOTE — Progress Notes (Signed)
Pt. came in for pregnancy test-Positive, took 4 at home all positive. LMP: 10/30/17, EDD:08/06/18, ga: 7657w5d.Pt was feeling dating is way off due to her weight gain & irregular periods, so had provider to talk with her. Provider ordered some labs and also US.

## 2017-11-11 NOTE — Addendum Note (Signed)
Addended by: Clayton BiblesWEINHOLD, SAMANTHA C on: 11/11/2017 11:45 AM   Modules accepted: Orders

## 2017-11-12 LAB — CBC
HEMATOCRIT: 35.2 % (ref 34.0–46.6)
HEMOGLOBIN: 10.7 g/dL — AB (ref 11.1–15.9)
MCH: 21.3 pg — ABNORMAL LOW (ref 26.6–33.0)
MCHC: 30.4 g/dL — AB (ref 31.5–35.7)
MCV: 70 fL — ABNORMAL LOW (ref 79–97)
Platelets: 378 10*3/uL (ref 150–450)
RBC: 5.03 x10E6/uL (ref 3.77–5.28)
RDW: 17.6 % — ABNORMAL HIGH (ref 12.3–15.4)
WBC: 12.2 10*3/uL — ABNORMAL HIGH (ref 3.4–10.8)

## 2017-11-12 LAB — ABO AND RH: RH TYPE: POSITIVE

## 2017-11-12 LAB — BETA HCG QUANT (REF LAB): hCG Quant: 16480 m[IU]/mL

## 2017-11-18 ENCOUNTER — Ambulatory Visit (HOSPITAL_BASED_OUTPATIENT_CLINIC_OR_DEPARTMENT_OTHER)
Admission: RE | Admit: 2017-11-18 | Discharge: 2017-11-18 | Disposition: A | Discharge status: Home / Self Care | Payer: BLUE CROSS/BLUE SHIELD | Source: Ambulatory Visit | Attending: Advanced Practice Midwife | Admitting: Advanced Practice Midwife

## 2017-11-18 ENCOUNTER — Encounter (HOSPITAL_COMMUNITY): Payer: Self-pay

## 2017-11-18 ENCOUNTER — Other Ambulatory Visit: Payer: Self-pay | Admitting: Advanced Practice Midwife

## 2017-11-18 ENCOUNTER — Ambulatory Visit (HOSPITAL_COMMUNITY)
Admission: RE | Admit: 2017-11-18 | Discharge: 2017-11-18 | Disposition: A | Discharge status: Home / Self Care | Payer: BLUE CROSS/BLUE SHIELD | Source: Ambulatory Visit | Attending: Advanced Practice Midwife | Admitting: Advanced Practice Midwife

## 2017-11-18 ENCOUNTER — Telehealth (HOSPITAL_COMMUNITY): Payer: Self-pay | Admitting: Obstetrics and Gynecology

## 2017-11-18 DIAGNOSIS — Z3201 Encounter for pregnancy test, result positive: Secondary | ICD-10-CM

## 2017-11-18 DIAGNOSIS — O99212 Obesity complicating pregnancy, second trimester: Secondary | ICD-10-CM

## 2017-11-18 DIAGNOSIS — Z363 Encounter for antenatal screening for malformations: Secondary | ICD-10-CM | POA: Diagnosis present

## 2017-11-18 DIAGNOSIS — Z3A17 17 weeks gestation of pregnancy: Secondary | ICD-10-CM | POA: Diagnosis not present

## 2017-11-18 DIAGNOSIS — O359XX0 Maternal care for (suspected) fetal abnormality and damage, unspecified, not applicable or unspecified: Secondary | ICD-10-CM

## 2017-11-18 DIAGNOSIS — Z3687 Encounter for antenatal screening for uncertain dates: Secondary | ICD-10-CM | POA: Diagnosis not present

## 2017-11-21 ENCOUNTER — Other Ambulatory Visit (HOSPITAL_COMMUNITY): Payer: Self-pay | Admitting: *Deleted

## 2017-11-21 DIAGNOSIS — O358XX0 Maternal care for other (suspected) fetal abnormality and damage, not applicable or unspecified: Secondary | ICD-10-CM

## 2017-11-21 DIAGNOSIS — O35EXX0 Maternal care for other (suspected) fetal abnormality and damage, fetal genitourinary anomalies, not applicable or unspecified: Secondary | ICD-10-CM

## 2017-11-22 ENCOUNTER — Encounter: Payer: Self-pay | Admitting: Family Medicine
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
## 2017-11-25 ENCOUNTER — Ambulatory Visit (INDEPENDENT_AMBULATORY_CARE_PROVIDER_SITE_OTHER): Payer: BLUE CROSS/BLUE SHIELD | Admitting: Obstetrics and Gynecology

## 2017-11-25 ENCOUNTER — Other Ambulatory Visit (HOSPITAL_COMMUNITY)
Admission: RE | Admit: 2017-11-25 | Discharge: 2017-11-25 | Disposition: A | Discharge status: Home / Self Care | Payer: BLUE CROSS/BLUE SHIELD | Source: Ambulatory Visit | Attending: Obstetrics and Gynecology | Admitting: Obstetrics and Gynecology

## 2017-11-25 ENCOUNTER — Encounter: Payer: Self-pay | Admitting: Obstetrics and Gynecology

## 2017-11-25 ENCOUNTER — Ambulatory Visit: Payer: BLUE CROSS/BLUE SHIELD | Admitting: Clinical

## 2017-11-25 DIAGNOSIS — Z3A00 Weeks of gestation of pregnancy not specified: Secondary | ICD-10-CM | POA: Insufficient documentation

## 2017-11-25 DIAGNOSIS — Z3492 Encounter for supervision of normal pregnancy, unspecified, second trimester: Secondary | ICD-10-CM

## 2017-11-25 DIAGNOSIS — Z3480 Encounter for supervision of other normal pregnancy, unspecified trimester: Secondary | ICD-10-CM | POA: Insufficient documentation

## 2017-11-25 DIAGNOSIS — O0930 Supervision of pregnancy with insufficient antenatal care, unspecified trimester: Secondary | ICD-10-CM

## 2017-11-25 DIAGNOSIS — Z3490 Encounter for supervision of normal pregnancy, unspecified, unspecified trimester: Secondary | ICD-10-CM | POA: Insufficient documentation

## 2017-11-25 LAB — POCT URINALYSIS DIP (DEVICE)
BILIRUBIN URINE: NEGATIVE
Glucose, UA: NEGATIVE mg/dL
Hgb urine dipstick: NEGATIVE
KETONES UR: NEGATIVE mg/dL
Nitrite: NEGATIVE
PH: 7 (ref 5.0–8.0)
Protein, ur: NEGATIVE mg/dL
Specific Gravity, Urine: 1.02 (ref 1.005–1.030)
Urobilinogen, UA: 0.2 mg/dL (ref 0.0–1.0)

## 2017-11-25 MED ORDER — PRENATAL VITAMINS 0.8 MG PO TABS
1.0000 | ORAL_TABLET | Freq: Every day | ORAL | 12 refills | Status: DC
Start: 2017-11-25 — End: 2020-04-03

## 2017-11-25 NOTE — Progress Notes (Signed)
  Subjective:    Jaclyn Tyler is a [id number] Unknown being seen today for her first obstetrical visit.  Her obstetrical history is significant for late onset of care, previous full term SVD x 3. Patient does intend to breast feed. Pregnancy history fully reviewed.  Patient reports no complaints.  Vitals:   11/25/17 1022  BP: 118/67  Pulse: 87  Weight: 247 lb (112 kg)    HISTORY: OB History  Gravida Para Term Preterm AB Living  7 3 3  0 2 3  SAB TAB Ectopic Multiple Live Births  2 0 0 0 3    # Outcome Date GA Lbr Len/2nd Weight Sex Delivery Anes PTL Lv  7 Current           6 Term 12/17/14 5563w0d 05:09 / 00:16 6 lb 5.2 oz (2.87 kg) F Vag-Spont EPI  LIV     Birth Comments: WNL  5 Term 2011 3914w0d  7 lb 2 oz (3.232 kg) F Vag-Spont   LIV  4 Term 2009 4914w0d  7 lb 5 oz (3.317 kg) F Vag-Spont   LIV  3 Gravida           2 SAB              Birth Comments: System Generated. Please review and update pregnancy details.  1 SAB            Past Medical History:  Diagnosis Date  . Anemia   . Constipation   . GERD (gastroesophageal reflux disease)   . Gonorrhea    Past Surgical History:  Procedure Laterality Date  . WISDOM TOOTH EXTRACTION     History reviewed. No pertinent family history.   Exam    Uterus:     Pelvic Exam:    Perineum: No Hemorrhoids, Normal Perineum   Vulva: normal   Vagina:  normal mucosa, normal discharge   pH:    Cervix: multiparous appearance   Adnexa: normal adnexa and no mass, fullness, tenderness   Bony Pelvis: gynecoid  System: Breast:  normal appearance, no masses or tenderness   Skin: normal coloration and turgor, no rashes    Neurologic: oriented, no focal deficits   Extremities: normal strength, tone, and muscle mass   HEENT extra ocular movement intact   Mouth/Teeth mucous membranes moist, pharynx normal without lesions and dental hygiene good   Neck supple and no masses   Cardiovascular: regular rate and rhythm   Respiratory:  appears  well, vitals normal, no respiratory distress, acyanotic, normal RR, chest clear, no wheezing, crepitations, rhonchi, normal symmetric air entry   Abdomen: soft, non-tender; bowel sounds normal; no masses,  no organomegaly   Urinary:       Assessment:    Pregnancy: [id number] Patient Active Problem List   Diagnosis Date Noted  . Supervision of low-risk pregnancy 11/25/2017  . Late prenatal care 11/25/2017        Plan:     Initial labs drawn. Prenatal vitamins. Problem list reviewed and updated. Genetic Screening discussed : Panorama ordered .  Ultrasound discussed; fetal survey: results reviewed. Patient scheduled for follow up in August to follow up on renal pyelectasis and choroid plexus cyst  Follow up in 4 weeks. 50% of 30 min visit spent on counseling and coordination of care.     Adan Beal 11/25/2017

## 2017-11-25 NOTE — Patient Instructions (Addendum)
 Second Trimester of Pregnancy The second trimester is from week 14 through week 27 (months 4 through 6). The second trimester is often a time when you feel your best. Your body has adjusted to being pregnant, and you begin to feel better physically. Usually, morning sickness has lessened or quit completely, you may have more energy, and you may have an increase in appetite. The second trimester is also a time when the fetus is growing rapidly. At the end of the sixth month, the fetus is about 9 inches long and weighs about 1 pounds. You will likely begin to feel the baby move (quickening) between 16 and 20 weeks of pregnancy. Body changes during your second trimester Your body continues to go through many changes during your second trimester. The changes vary from woman to woman.  Your weight will continue to increase. You will notice your lower abdomen bulging out.  You may begin to get stretch marks on your hips, abdomen, and breasts.  You may develop headaches that can be relieved by medicines. The medicines should be approved by your health care provider.  You may urinate more often because the fetus is pressing on your bladder.  You may develop or continue to have heartburn as a result of your pregnancy.  You may develop constipation because certain hormones are causing the muscles that push waste through your intestines to slow down.  You may develop hemorrhoids or swollen, bulging veins (varicose veins).  You may have back pain. This is caused by: ? Weight gain. ? Pregnancy hormones that are relaxing the joints in your pelvis. ? A shift in weight and the muscles that support your balance.  Your breasts will continue to grow and they will continue to become tender.  Your gums may bleed and may be sensitive to brushing and flossing.  Dark spots or blotches (chloasma, mask of pregnancy) may develop on your face. This will likely fade after the baby is born.  A dark line from  your belly button to the pubic area (linea nigra) may appear. This will likely fade after the baby is born.  You may have changes in your hair. These can include thickening of your hair, rapid growth, and changes in texture. Some women also have hair loss during or after pregnancy, or hair that feels dry or thin. Your hair will most likely return to normal after your baby is born.  What to expect at prenatal visits During a routine prenatal visit:  You will be weighed to make sure you and the fetus are growing normally.  Your blood pressure will be taken.  Your abdomen will be measured to track your baby's growth.  The fetal heartbeat will be listened to.  Any test results from the previous visit will be discussed.  Your health care provider may ask you:  How you are feeling.  If you are feeling the baby move.  If you have had any abnormal symptoms, such as leaking fluid, bleeding, severe headaches, or abdominal cramping.  If you are using any tobacco products, including cigarettes, chewing tobacco, and electronic cigarettes.  If you have any questions.  Other tests that may be performed during your second trimester include:  Blood tests that check for: ? Low iron levels (anemia). ? High blood sugar that affects pregnant women (gestational diabetes) between 24 and 28 weeks. ? Rh antibodies. This is to check for a protein on red blood cells (Rh factor).  Urine tests to check for infections, diabetes,   or protein in the urine.  An ultrasound to confirm the proper growth and development of the baby.  An amniocentesis to check for possible genetic problems.  Fetal screens for spina bifida and Down syndrome.  HIV (human immunodeficiency virus) testing. Routine prenatal testing includes screening for HIV, unless you choose not to have this test.  Follow these instructions at home: Medicines  Follow your health care provider's instructions regarding medicine use. Specific  medicines may be either safe or unsafe to take during pregnancy.  Take a prenatal vitamin that contains at least 600 micrograms (mcg) of folic acid.  If you develop constipation, try taking a stool softener if your health care provider approves. Eating and drinking  Eat a balanced diet that includes fresh fruits and vegetables, whole grains, good sources of protein such as meat, eggs, or tofu, and low-fat dairy. Your health care provider will help you determine the amount of weight gain that is right for you.  Avoid raw meat and uncooked cheese. These carry germs that can cause birth defects in the baby.  If you have low calcium intake from food, talk to your health care provider about whether you should take a daily calcium supplement.  Limit foods that are high in fat and processed sugars, such as fried and sweet foods.  To prevent constipation: ? Drink enough fluid to keep your urine clear or pale yellow. ? Eat foods that are high in fiber, such as fresh fruits and vegetables, whole grains, and beans. Activity  Exercise only as directed by your health care provider. Most women can continue their usual exercise routine during pregnancy. Try to exercise for 30 minutes at least 5 days a week. Stop exercising if you experience uterine contractions.  Avoid heavy lifting, wear low heel shoes, and practice good posture.  A sexual relationship may be continued unless your health care provider directs you otherwise. Relieving pain and discomfort  Wear a good support bra to prevent discomfort from breast tenderness.  Take warm sitz baths to soothe any pain or discomfort caused by hemorrhoids. Use hemorrhoid cream if your health care provider approves.  Rest with your legs elevated if you have leg cramps or low back pain.  If you develop varicose veins, wear support hose. Elevate your feet for 15 minutes, 3-4 times a day. Limit salt in your diet. Prenatal Care  Write down your questions.  Take them to your prenatal visits.  Keep all your prenatal visits as told by your health care provider. This is important. Safety  Wear your seat belt at all times when driving.  Make a list of emergency phone numbers, including numbers for family, friends, the hospital, and police and fire departments. General instructions  Ask your health care provider for a referral to a local prenatal education class. Begin classes no later than the beginning of month 6 of your pregnancy.  Ask for help if you have counseling or nutritional needs during pregnancy. Your health care provider can offer advice or refer you to specialists for help with various needs.  Do not use hot tubs, steam rooms, or saunas.  Do not douche or use tampons or scented sanitary pads.  Do not cross your legs for long periods of time.  Avoid cat litter boxes and soil used by cats. These carry germs that can cause birth defects in the baby and possibly loss of the fetus by miscarriage or stillbirth.  Avoid all smoking, herbs, alcohol, and unprescribed drugs. Chemicals in these products   can affect the formation and growth of the baby.  Do not use any products that contain nicotine or tobacco, such as cigarettes and e-cigarettes. If you need help quitting, ask your health care provider.  Visit your dentist if you have not gone yet during your pregnancy. Use a soft toothbrush to brush your teeth and be gentle when you floss. Contact a health care provider if:  You have dizziness.  You have mild pelvic cramps, pelvic pressure, or nagging pain in the abdominal area.  You have persistent nausea, vomiting, or diarrhea.  You have a bad smelling vaginal discharge.  You have pain when you urinate. Get help right away if:  You have a fever.  You are leaking fluid from your vagina.  You have spotting or bleeding from your vagina.  You have severe abdominal cramping or pain.  You have rapid weight gain or weight  loss.  You have shortness of breath with chest pain.  You notice sudden or extreme swelling of your face, hands, ankles, feet, or legs.  You have not felt your baby move in over an hour.  You have severe headaches that do not go away when you take medicine.  You have vision changes. Summary  The second trimester is from week 14 through week 27 (months 4 through 6). It is also a time when the fetus is growing rapidly.  Your body goes through many changes during pregnancy. The changes vary from woman to woman.  Avoid all smoking, herbs, alcohol, and unprescribed drugs. These chemicals affect the formation and growth your baby.  Do not use any tobacco products, such as cigarettes, chewing tobacco, and e-cigarettes. If you need help quitting, ask your health care provider.  Contact your health care provider if you have any questions. Keep all prenatal visits as told by your health care provider. This is important. This information is not intended to replace advice given to you by your health care provider. Make sure you discuss any questions you have with your health care provider. Document Released: 04/06/2001 Document Revised: 05/18/2016 Document Reviewed: 05/18/2016 [employer] Interactive Patient Education  2018 [employer]   Contraception Choices Contraception, also called birth control, refers to methods or devices that prevent pregnancy. Hormonal methods Contraceptive implant A contraceptive implant is a thin, plastic tube that contains a hormone. It is inserted into the upper part of the arm. It can remain in place for up to 3 years. Progestin-only injections Progestin-only injections are injections of progestin, a synthetic form of the hormone progesterone. They are given every 3 months by a health care provider. Birth control pills Birth control pills are pills that contain hormones that prevent pregnancy. They must be taken once a day, preferably at the same time each  day. Birth control patch The birth control patch contains hormones that prevent pregnancy. It is placed on the skin and must be changed once a week for three weeks and removed on the fourth week. A prescription is needed to use this method of contraception. Vaginal ring A vaginal ring contains hormones that prevent pregnancy. It is placed in the vagina for three weeks and removed on the fourth week. After that, the process is repeated with a new ring. A prescription is needed to use this method of contraception. Emergency contraceptive Emergency contraceptives prevent pregnancy after unprotected sex. They come in pill form and can be taken up to 5 days after sex. They work best the sooner they are taken after having sex. Most emergency   contraceptives are available without a prescription. This method should not be used as your only form of birth control. Barrier methods Female condom A female condom is a thin sheath that is worn over the penis during sex. Condoms keep sperm from going inside a woman's body. They can be used with a spermicide to increase their effectiveness. They should be disposed after a single use. Female condom A female condom is a soft, loose-fitting sheath that is put into the vagina before sex. The condom keeps sperm from going inside a woman's body. They should be disposed after a single use. Diaphragm A diaphragm is a soft, dome-shaped barrier. It is inserted into the vagina before sex, along with a spermicide. The diaphragm blocks sperm from entering the uterus, and the spermicide kills sperm. A diaphragm should be left in the vagina for 6-8 hours after sex and removed within 24 hours. A diaphragm is prescribed and fitted by a health care provider. A diaphragm should be replaced every 1-2 years, after giving birth, after gaining more than 15 lb (6.8 kg), and after pelvic surgery. Cervical cap A cervical cap is a round, soft latex or plastic cup that fits over the cervix. It is  inserted into the vagina before sex, along with spermicide. It blocks sperm from entering the uterus. The cap should be left in place for 6-8 hours after sex and removed within 48 hours. A cervical cap must be prescribed and fitted by a health care provider. It should be replaced every 2 years. Sponge A sponge is a soft, circular piece of polyurethane foam with spermicide on it. The sponge helps block sperm from entering the uterus, and the spermicide kills sperm. To use it, you make it wet and then insert it into the vagina. It should be inserted before sex, left in for at least 6 hours after sex, and removed and thrown away within 30 hours. Spermicides Spermicides are chemicals that kill or block sperm from entering the cervix and uterus. They can come as a cream, jelly, suppository, foam, or tablet. A spermicide should be inserted into the vagina with an applicator at least 10-15 minutes before sex to allow time for it to work. The process must be repeated every time you have sex. Spermicides do not require a prescription. Intrauterine contraception Intrauterine device (IUD) An IUD is a T-shaped device that is put in a woman's uterus. There are two types:  Hormone IUD.This type contains progestin, a synthetic form of the hormone progesterone. This type can stay in place for 3-5 years.  Copper IUD.This type is wrapped in copper wire. It can stay in place for 10 years.  Permanent methods of contraception Female tubal ligation In this method, a woman's fallopian tubes are sealed, tied, or blocked during surgery to prevent eggs from traveling to the uterus. Hysteroscopic sterilization In this method, a small, flexible insert is placed into each fallopian tube. The inserts cause scar tissue to form in the fallopian tubes and block them, so sperm cannot reach an egg. The procedure takes about 3 months to be effective. Another form of birth control must be used during those 3 months. Female  sterilization This is a procedure to tie off the tubes that carry sperm (vasectomy). After the procedure, the man can still ejaculate fluid (semen). Natural planning methods Natural family planning In this method, a couple does not have sex on days when the woman could become pregnant. Calendar method This means keeping track of the length of each   menstrual cycle, identifying the days when pregnancy can happen, and not having sex on those days. Ovulation method In this method, a couple avoids sex during ovulation. Symptothermal method This method involves not having sex during ovulation. The woman typically checks for ovulation by watching changes in her temperature and in the consistency of cervical mucus. Post-ovulation method In this method, a couple waits to have sex until after ovulation. Summary  Contraception, also called birth control, means methods or devices that prevent pregnancy.  Hormonal methods of contraception include implants, injections, pills, patches, vaginal rings, and emergency contraceptives.  Barrier methods of contraception can include female condoms, female condoms, diaphragms, cervical caps, sponges, and spermicides.  There are two types of IUDs (intrauterine devices). An IUD can be put in a woman's uterus to prevent pregnancy for 3-5 years.  Permanent sterilization can be done through a procedure for males, females, or both.  Natural family planning methods involve not having sex on days when the woman could become pregnant. This information is not intended to replace advice given to you by your health care provider. Make sure you discuss any questions you have with your health care provider. Document Released: 04/12/2005 Document Revised: 05/15/2016 Document Reviewed: 05/15/2016 [employer] Interactive Patient Education  2018 [employer]   Breastfeeding Choosing to breastfeed is one of the best decisions you can make for yourself and your baby. A change in  hormones during pregnancy causes your breasts to make breast milk in your milk-producing glands. Hormones prevent breast milk from being released before your baby is born. They also prompt milk flow after birth. Once breastfeeding has begun, thoughts of your baby, as well as his or her sucking or crying, can stimulate the release of milk from your milk-producing glands. Benefits of breastfeeding Research shows that breastfeeding offers many health benefits for infants and mothers. It also offers a cost-free and convenient way to feed your baby. For your baby  Your first milk (colostrum) helps your baby's digestive system to function better.  Special cells in your milk (antibodies) help your baby to fight off infections.  Breastfed babies are less likely to develop asthma, allergies, obesity, or type 2 diabetes. They are also at lower risk for sudden infant death syndrome (SIDS).  Nutrients in breast milk are better able to meet your baby's needs compared to infant formula.  Breast milk improves your baby's brain development. For you  Breastfeeding helps to create a very special bond between you and your baby.  Breastfeeding is convenient. Breast milk costs nothing and is always available at the correct temperature.  Breastfeeding helps to burn calories. It helps you to lose the weight that you gained during pregnancy.  Breastfeeding makes your uterus return faster to its size before pregnancy. It also slows bleeding (lochia) after you give birth.  Breastfeeding helps to lower your risk of developing type 2 diabetes, osteoporosis, rheumatoid arthritis, cardiovascular disease, and breast, ovarian, uterine, and endometrial cancer later in life. Breastfeeding basics Starting breastfeeding  Find a comfortable place to sit or lie down, with your neck and back well-supported.  Place a pillow or a rolled-up blanket under your baby to bring him or her to the level of your breast (if you are  seated). Nursing pillows are specially designed to help support your arms and your baby while you breastfeed.  Make sure that your baby's tummy (abdomen) is facing your abdomen.  Gently massage your breast. With your fingertips, massage from the outer edges of your breast inward   toward the nipple. This encourages milk flow. If your milk flows slowly, you may need to continue this action during the feeding.  Support your breast with 4 fingers underneath and your thumb above your nipple (make the letter "C" with your hand). Make sure your fingers are well away from your nipple and your baby's mouth.  Stroke your baby's lips gently with your finger or nipple.  When your baby's mouth is open wide enough, quickly bring your baby to your breast, placing your entire nipple and as much of the areola as possible into your baby's mouth. The areola is the colored area around your nipple. ? More areola should be visible above your baby's upper lip than below the lower lip. ? Your baby's lips should be opened and extended outward (flanged) to ensure an adequate, comfortable latch. ? Your baby's tongue should be between his or her lower gum and your breast.  Make sure that your baby's mouth is correctly positioned around your nipple (latched). Your baby's lips should create a seal on your breast and be turned out (everted).  It is common for your baby to suck about 2-3 minutes in order to start the flow of breast milk. Latching Teaching your baby how to latch onto your breast properly is very important. An improper latch can cause nipple pain, decreased milk supply, and poor weight gain in your baby. Also, if your baby is not latched onto your nipple properly, he or she may swallow some air during feeding. This can make your baby fussy. Burping your baby when you switch breasts during the feeding can help to get rid of the air. However, teaching your baby to latch on properly is still the best way to prevent  fussiness from swallowing air while breastfeeding. Signs that your baby has successfully latched onto your nipple  Silent tugging or silent sucking, without causing you pain. Infant's lips should be extended outward (flanged).  Swallowing heard between every 3-4 sucks once your milk has started to flow (after your let-down milk reflex occurs).  Muscle movement above and in front of his or her ears while sucking.  Signs that your baby has not successfully latched onto your nipple  Sucking sounds or smacking sounds from your baby while breastfeeding.  Nipple pain.  If you think your baby has not latched on correctly, slip your finger into the corner of your baby's mouth to break the suction and place it between your baby's gums. Attempt to start breastfeeding again. Signs of successful breastfeeding Signs from your baby  Your baby will gradually decrease the number of sucks or will completely stop sucking.  Your baby will fall asleep.  Your baby's body will relax.  Your baby will retain a small amount of milk in his or her mouth.  Your baby will let go of your breast by himself or herself.  Signs from you  Breasts that have increased in firmness, weight, and size 1-3 hours after feeding.  Breasts that are softer immediately after breastfeeding.  Increased milk volume, as well as a change in milk consistency and color by the fifth day of breastfeeding.  Nipples that are not sore, cracked, or bleeding.  Signs that your baby is getting enough milk  Wetting at least 1-2 diapers during the first 24 hours after birth.  Wetting at least 5-6 diapers every 24 hours for the first week after birth. The urine should be clear or pale yellow by the age of 5 days.  Wetting 6-8  diapers every 24 hours as your baby continues to grow and develop.  At least 3 stools in a 24-hour period by the age of 5 days. The stool should be soft and yellow.  At least 3 stools in a 24-hour period by the  age of 7 days. The stool should be seedy and yellow.  No loss of weight greater than 10% of birth weight during the first 3 days of life.  Average weight gain of 4-7 oz (113-198 g) per week after the age of 4 days.  Consistent daily weight gain by the age of 5 days, without weight loss after the age of 2 weeks. After a feeding, your baby may spit up a small amount of milk. This is normal. Breastfeeding frequency and duration Frequent feeding will help you make more milk and can prevent sore nipples and extremely full breasts (breast engorgement). Breastfeed when you feel the need to reduce the fullness of your breasts or when your baby shows signs of hunger. This is called "breastfeeding on demand." Signs that your baby is hungry include:  Increased alertness, activity, or restlessness.  Movement of the head from side to side.  Opening of the mouth when the corner of the mouth or cheek is stroked (rooting).  Increased sucking sounds, smacking lips, cooing, sighing, or squeaking.  Hand-to-mouth movements and sucking on fingers or hands.  Fussing or crying.  Avoid introducing a pacifier to your baby in the first 4-6 weeks after your baby is born. After this time, you may choose to use a pacifier. Research has shown that pacifier use during the first year of a baby's life decreases the risk of sudden infant death syndrome (SIDS). Allow your baby to feed on each breast as long as he or she wants. When your baby unlatches or falls asleep while feeding from the first breast, offer the second breast. Because newborns are often sleepy in the first few weeks of life, you may need to awaken your baby to get him or her to feed. Breastfeeding times will vary from baby to baby. However, the following rules can serve as a guide to help you make sure that your baby is properly fed:  Newborns (babies 4 weeks of age or younger) may breastfeed every 1-3 hours.  Newborns should not go without breastfeeding  for longer than 3 hours during the day or 5 hours during the night.  You should breastfeed your baby a minimum of 8 times in a 24-hour period.  Breast milk pumping Pumping and storing breast milk allows you to make sure that your baby is exclusively fed your breast milk, even at times when you are unable to breastfeed. This is especially important if you go back to work while you are still breastfeeding, or if you are not able to be present during feedings. Your lactation consultant can help you find a method of pumping that works best for you and give you guidelines about how long it is safe to store breast milk. Caring for your breasts while you breastfeed Nipples can become dry, cracked, and sore while breastfeeding. The following recommendations can help keep your breasts moisturized and healthy:  Avoid using soap on your nipples.  Wear a supportive bra designed especially for nursing. Avoid wearing underwire-style bras or extremely tight bras (sports bras).  Air-dry your nipples for 3-4 minutes after each feeding.  Use only cotton bra pads to absorb leaked breast milk. Leaking of breast milk between feedings is normal.  Use lanolin   on your nipples after breastfeeding. Lanolin helps to maintain your skin's normal moisture barrier. Pure lanolin is not harmful (not toxic) to your baby. You may also hand express a few drops of breast milk and gently massage that milk into your nipples and allow the milk to air-dry.  In the first few weeks after giving birth, some women experience breast engorgement. Engorgement can make your breasts feel heavy, warm, and tender to the touch. Engorgement peaks within 3-5 days after you give birth. The following recommendations can help to ease engorgement:  Completely empty your breasts while breastfeeding or pumping. You may want to start by applying warm, moist heat (in the shower or with warm, water-soaked hand towels) just before feeding or pumping. This  increases circulation and helps the milk flow. If your baby does not completely empty your breasts while breastfeeding, pump any extra milk after he or she is finished.  Apply ice packs to your breasts immediately after breastfeeding or pumping, unless this is too uncomfortable for you. To do this: ? Put ice in a plastic bag. ? Place a towel between your skin and the bag. ? Leave the ice on for 20 minutes, 2-3 times a day.  Make sure that your baby is latched on and positioned properly while breastfeeding.  If engorgement persists after 48 hours of following these recommendations, contact your health care provider or a lactation consultant. Overall health care recommendations while breastfeeding  Eat 3 healthy meals and 3 snacks every day. Well-nourished mothers who are breastfeeding need an additional 450-500 calories a day. You can meet this requirement by increasing the amount of a balanced diet that you eat.  Drink enough water to keep your urine pale yellow or clear.  Rest often, relax, and continue to take your prenatal vitamins to prevent fatigue, stress, and low vitamin and mineral levels in your body (nutrient deficiencies).  Do not use any products that contain nicotine or tobacco, such as cigarettes and e-cigarettes. Your baby may be harmed by chemicals from cigarettes that pass into breast milk and exposure to secondhand smoke. If you need help quitting, ask your health care provider.  Avoid alcohol.  Do not use illegal drugs or marijuana.  Talk with your health care provider before taking any medicines. These include over-the-counter and prescription medicines as well as vitamins and herbal supplements. Some medicines that may be harmful to your baby can pass through breast milk.  It is possible to become pregnant while breastfeeding. If birth control is desired, ask your health care provider about options that will be safe while breastfeeding your baby. Where to find more  information: La [employer]ec[employer]: www.llli.org Contact a health care provider if:  You feel like you want to stop breastfeeding or have become frustrated with breastfeeding.  Your nipples are cracked or bleeding.  Your breasts are red, tender, or warm.  You have: ? Painful breasts or nipples. ? A swollen area on either breast. ? A fever or chills. ? Nausea or vomiting. ? Drainage other than breast milk from your nipples.  Your breasts do not become full before feedings by the fifth day after you give birth.  You feel sad and depressed.  Your baby is: ? Too sleepy to eat well. ? Having trouble sleeping. ? More than 1 week old and wetting fewer than 6 diapers in a 24-hour period. ? Not gaining weight by 5 days of age.  Your baby has fewer than 3 stools in a 24-hour period.    Your baby's skin or the white parts of his or her eyes become yellow. Get help right away if:  Your baby is overly tired (lethargic) and does not want to wake up and feed.  Your baby develops an unexplained fever. Summary  Breastfeeding offers many health benefits for infant and mothers.  Try to breastfeed your infant when he or she shows early signs of hunger.  Gently tickle or stroke your baby's lips with your finger or nipple to allow the baby to open his or her mouth. Bring the baby to your breast. Make sure that much of the areola is in your baby's mouth. Offer one side and burp the baby before you offer the other side.  Talk with your health care provider or lactation consultant if you have questions or you face problems as you breastfeed. This information is not intended to replace advice given to you by your health care provider. Make sure you discuss any questions you have with your health care provider. Document Released: 04/12/2005 Document Revised: 05/14/2016 Document Reviewed: 05/14/2016 Elsevier Interactive Patient Education  2018 [employer].  AREA PEDIATRIC/FAMILY PRACTICE  PHYSICIANS  [facility] [address] [place], [place]  [address] Phone - [phone]   Fax - [phone]  [facility] [address] [place], [place] [address] Phone - [phone]   Fax - [phone]  [facility] AMOS [address] [place], [place]  [address] Phone - [phone]   Fax - [phone]  [facility] [address] [place], [place]  [address] Phone - [phone]   Fax - [phone]  [facility] [address] [place], [place]  [address] Phone - [phone]   Fax - [phone]  [facility] [address] [place], [place]  [address] Phone - [phone]   Fax - [phone]  [facility] [place] [address] [place], [place]  [address] Phone - [phone]   Fax - [phone]  [facility] AT [facility] [address] [place], [address] [place], [place]  [address] Phone - [phone]   Fax - [phone]  [facility] AT [facility] [address] [place], [place]  [address] Phone - [phone]   Fax - [phone] [facility] [place] [address] [place], [place]  [address] Phone - [phone]   Fax - [phone]  [facility] [address] [place], [place]  [address] Phone - [phone]   Fax - [phone]  [facility] [address], [address] [place], [place]  [address] Phone - [phone]   Fax - [phone]  [facility] [place] [address] [place], [place]  [address] Phone - [phone]   Fax - [phone]  [facility] [address] [place], [place]  [address] Phone - [phone]  [facility] [address] [place], [place]  [address] Phone - [phone]   Fax - [phone]  [facility] [address] [place], [place]   [address] Phone - [phone]   Fax - [phone]  [facility] [address] [place], [place]  [address] Phone - [phone]   Fax - [phone]  [facility] [address] [place], [place]  [address] Phone - [phone]   Fax - [phone]   [facility] [address] [place], [place]  [address] Phone -  [phone]   Fax - [phone]  [facility] [address] [place], [place]  [address] Phone - [phone]   Fax - [phone]  DAVID RUBIN [address] [place], [place]  [address] Phone - [phone]   Fax - [phone]  [facility] [address] [place], [place]  [address] Phone - [phone]   Fax - [phone]  [facility] [address] [place], [place]  [address] Phone - [phone]   Fax - [phone] [facility] [place] [address] [place], [place]  [address] Phone - [phone]   Fax - [phone]  [facility] [place] [address] [place], [place]  [address] Phone - [phone]   Fax - [phone]  [facility] [place] [address] [place], [place]  [address] Phone - [phone]   Fax - [phone]  [facility] [place] Wyvonne Lenzharlene Flemming MD [address] [place] [address] Phone [phone]  Fax [phone]

## 2017-11-27 LAB — CULTURE, OB URINE

## 2017-11-27 LAB — URINE CULTURE, OB REFLEX: ORGANISM ID, BACTERIA: NO GROWTH

## 2017-11-29 LAB — CYTOLOGY - PAP
CHLAMYDIA, DNA PROBE: NEGATIVE
Diagnosis: NEGATIVE
Neisseria Gonorrhea: NEGATIVE

## 2017-12-06 LAB — OBSTETRIC PANEL, INCLUDING HIV
Antibody Screen: NEGATIVE
BASOS ABS: 0.1 10*3/uL (ref 0.0–0.2)
Basos: 1 %
EOS (ABSOLUTE): 0.1 10*3/uL (ref 0.0–0.4)
Eos: 1 %
HIV SCREEN 4TH GENERATION: NONREACTIVE
Hematocrit: 33.2 % — ABNORMAL LOW (ref 34.0–46.6)
Hemoglobin: 10 g/dL — ABNORMAL LOW (ref 11.1–15.9)
Hepatitis B Surface Ag: NEGATIVE
Immature Grans (Abs): 0.1 10*3/uL (ref 0.0–0.1)
Immature Granulocytes: 1 %
LYMPHS ABS: 2.2 10*3/uL (ref 0.7–3.1)
Lymphs: 18 %
MCH: 21.3 pg — AB (ref 26.6–33.0)
MCHC: 30.1 g/dL — ABNORMAL LOW (ref 31.5–35.7)
MCV: 71 fL — AB (ref 79–97)
Monocytes Absolute: 0.8 10*3/uL (ref 0.1–0.9)
Monocytes: 7 %
NEUTROS ABS: 8.6 10*3/uL — AB (ref 1.4–7.0)
NEUTROS PCT: 72 %
PLATELETS: 392 10*3/uL (ref 150–450)
RBC: 4.7 x10E6/uL (ref 3.77–5.28)
RDW: 17.6 % — AB (ref 12.3–15.4)
RPR Ser Ql: NONREACTIVE
Rh Factor: POSITIVE
Rubella Antibodies, IGG: 3.46 index (ref >0.99)
WBC: 11.8 10*3/uL — AB (ref 3.4–10.8)

## 2017-12-06 LAB — SMN1 COPY NUMBER ANALYSIS (SMA CARRIER SCREENING)

## 2017-12-06 LAB — HEMOGLOBINOPATHY EVALUATION
HEMOGLOBIN A2 QUANTITATION: 2.3 % (ref 1.8–3.2)
HGB C: 0 %
HGB S: 0 %
HGB VARIANT: 0 %
Hemoglobin F Quantitation: 0 % (ref 0.0–2.0)
Hgb A: 97.7 % (ref 96.4–98.8)

## 2017-12-06 LAB — CYSTIC FIBROSIS MUTATION 97: Interpretation: NOT DETECTED

## 2017-12-14 ENCOUNTER — Encounter: Payer: Self-pay | Admitting: *Deleted

## 2017-12-14 ENCOUNTER — Encounter (HOSPITAL_COMMUNITY): Payer: Self-pay

## 2017-12-16 ENCOUNTER — Encounter (HOSPITAL_COMMUNITY): Payer: Self-pay

## 2017-12-16 ENCOUNTER — Ambulatory Visit (HOSPITAL_COMMUNITY)
Admission: RE | Admit: 2017-12-16 | Discharge: 2017-12-16 | Disposition: A | Discharge status: Home / Self Care | Payer: Medicaid Other | Source: Ambulatory Visit | Attending: Advanced Practice Midwife | Admitting: Advanced Practice Midwife

## 2017-12-16 DIAGNOSIS — O99212 Obesity complicating pregnancy, second trimester: Secondary | ICD-10-CM

## 2017-12-16 DIAGNOSIS — O359XX0 Maternal care for (suspected) fetal abnormality and damage, unspecified, not applicable or unspecified: Secondary | ICD-10-CM | POA: Diagnosis not present

## 2017-12-16 DIAGNOSIS — O358XX0 Maternal care for other (suspected) fetal abnormality and damage, not applicable or unspecified: Secondary | ICD-10-CM | POA: Diagnosis not present

## 2017-12-16 DIAGNOSIS — Z362 Encounter for other antenatal screening follow-up: Secondary | ICD-10-CM

## 2017-12-16 DIAGNOSIS — O35EXX0 Maternal care for other (suspected) fetal abnormality and damage, fetal genitourinary anomalies, not applicable or unspecified: Secondary | ICD-10-CM

## 2017-12-16 DIAGNOSIS — Z3492 Encounter for supervision of normal pregnancy, unspecified, second trimester: Secondary | ICD-10-CM

## 2017-12-16 DIAGNOSIS — Z3A21 21 weeks gestation of pregnancy: Secondary | ICD-10-CM | POA: Insufficient documentation

## 2017-12-16 DIAGNOSIS — O0930 Supervision of pregnancy with insufficient antenatal care, unspecified trimester: Secondary | ICD-10-CM

## 2017-12-27 ENCOUNTER — Encounter: Payer: Self-pay | Admitting: Advanced Practice Midwife

## 2017-12-27 ENCOUNTER — Ambulatory Visit (INDEPENDENT_AMBULATORY_CARE_PROVIDER_SITE_OTHER): Payer: Medicaid Other | Admitting: Advanced Practice Midwife

## 2017-12-27 VITALS — BP 119/71 | HR 89 | Wt 246.0 lb

## 2017-12-27 DIAGNOSIS — Z3492 Encounter for supervision of normal pregnancy, unspecified, second trimester: Secondary | ICD-10-CM

## 2017-12-27 NOTE — Progress Notes (Signed)
   PRENATAL VISIT NOTE  Subjective:  Jaclyn Tyler is a 27 y.o. [phone] at [redacted]w[redacted]d being seen today for ongoing prenatal care.  She is currently monitored for the following issues for this low-risk pregnancy and has Supervision of low-risk pregnancy and Late prenatal care on their problem list.  Patient reports no complaints.  Contractions: Not present. Vag. Bleeding: None.  Movement: Present. Denies leaking of fluid.   The following portions of the patient's history were reviewed and updated as appropriate: allergies, current medications, past family history, past medical history, past social history, past surgical history and problem list. Problem list updated.  Objective:   Vitals:   12/27/17 1042  BP: 119/71  Pulse: 89  Weight: 246 lb (111.6 kg)    Fetal Status: Fetal Heart Rate (bpm): 151 Fundal Height: 25 cm Movement: Present     General:  Alert, oriented and cooperative. Patient is in no acute distress.  Skin: Skin is warm and dry. No rash noted.   Cardiovascular: Normal heart rate noted  Respiratory: Normal respiratory effort, no problems with respiration noted  Abdomen: Soft, gravid, appropriate for gestational age.  Pain/Pressure: Present     Pelvic: Cervical exam deferred        Extremities: Normal range of motion.  Edema: None  Mental Status: Normal mood and affect. Normal behavior. Normal judgment and thought content.   Assessment and Plan:  Pregnancy: [id number] at [redacted]w[redacted]d  1. Encounter for supervision of low-risk pregnancy in second trimester - Routine care - CBC; Future - Glucose Tolerance, 2 Hours w/1 Hour; Future - RPR; Future - HIV antibody; Future - MFM FU anatomy US ordered   Preterm labor symptoms and general obstetric precautions including but not limited to vaginal bleeding, contractions, leaking of fluid and fetal movement were reviewed in detail with the patient. Please refer to After Visit Summary for other counseling recommendations.  Return in about  4 weeks (around 01/24/2018) for 2 hour GTT and 28 week labs at next visit .  Future Appointments  Date Time Provider Department Center  01/13/2018 12:45 PM [facility] [place] 2 [facility]    Thressa Sheller, CNM

## 2018-01-04 ENCOUNTER — Encounter: Payer: Self-pay | Admitting: Family Medicine

## 2018-01-13 ENCOUNTER — Ambulatory Visit (HOSPITAL_COMMUNITY): Payer: Medicaid Other

## 2018-01-19 ENCOUNTER — Ambulatory Visit (HOSPITAL_COMMUNITY)
Admission: RE | Admit: 2018-01-19 | Discharge: 2018-01-19 | Disposition: A | Discharge status: Home / Self Care | Payer: Medicaid Other | Source: Ambulatory Visit | Attending: Advanced Practice Midwife | Admitting: Advanced Practice Midwife

## 2018-01-19 DIAGNOSIS — Z362 Encounter for other antenatal screening follow-up: Secondary | ICD-10-CM | POA: Diagnosis present

## 2018-01-19 DIAGNOSIS — O99212 Obesity complicating pregnancy, second trimester: Secondary | ICD-10-CM | POA: Diagnosis not present

## 2018-01-19 DIAGNOSIS — Z3492 Encounter for supervision of normal pregnancy, unspecified, second trimester: Secondary | ICD-10-CM

## 2018-01-19 DIAGNOSIS — Z3A26 26 weeks gestation of pregnancy: Secondary | ICD-10-CM | POA: Insufficient documentation

## 2018-01-24 ENCOUNTER — Encounter: Payer: Medicaid Other | Admitting: Advanced Practice Midwife

## 2018-01-25 ENCOUNTER — Encounter: Payer: Medicaid Other | Admitting: Nurse Practitioner

## 2018-01-25 ENCOUNTER — Telehealth: Payer: Self-pay | Admitting: Nurse Practitioner

## 2018-01-25 ENCOUNTER — Other Ambulatory Visit: Payer: Medicaid Other

## 2018-01-25 NOTE — Telephone Encounter (Signed)
Patient missed her OB appointment today. Left a VM for her to call us back and get rescheduled.

## 2018-01-31 ENCOUNTER — Encounter: Payer: Medicaid Other | Admitting: Advanced Practice Midwife

## 2018-02-03 ENCOUNTER — Other Ambulatory Visit: Payer: Medicaid Other

## 2018-02-03 ENCOUNTER — Ambulatory Visit (INDEPENDENT_AMBULATORY_CARE_PROVIDER_SITE_OTHER): Payer: Medicaid Other | Admitting: Student

## 2018-02-03 VITALS — BP 112/70 | HR 90 | Wt 249.4 lb

## 2018-02-03 DIAGNOSIS — Z3492 Encounter for supervision of normal pregnancy, unspecified, second trimester: Secondary | ICD-10-CM

## 2018-02-03 DIAGNOSIS — O99019 Anemia complicating pregnancy, unspecified trimester: Secondary | ICD-10-CM | POA: Insufficient documentation

## 2018-02-03 DIAGNOSIS — Z3493 Encounter for supervision of normal pregnancy, unspecified, third trimester: Secondary | ICD-10-CM

## 2018-02-03 DIAGNOSIS — O99013 Anemia complicating pregnancy, third trimester: Secondary | ICD-10-CM

## 2018-02-03 NOTE — Patient Instructions (Addendum)
Anemia Anemia is a condition in which you do not have enough red blood cells or hemoglobin. Hemoglobin is a substance in red blood cells that carries oxygen. When you do not have enough red blood cells or hemoglobin (are anemic), your body cannot get enough oxygen and your organs may not work properly. As a result, you may feel very tired or have other problems. What are the causes? Common causes of anemia include:  Excessive bleeding. Anemia can be caused by excessive bleeding inside or outside the body, including bleeding from the intestine or from periods in women.  Poor nutrition.  Long-lasting (chronic) kidney, thyroid, and liver disease.  Bone marrow disorders.  Cancer and treatments for cancer.  HIV (human immunodeficiency virus) and AIDS (acquired immunodeficiency syndrome).  Treatments for HIV and AIDS.  Spleen problems.  Blood disorders.  Infections, medicines, and autoimmune disorders that destroy red blood cells.  What are the signs or symptoms? Symptoms of this condition include:  Minor weakness.  Dizziness.  Headache.  Feeling heartbeats that are irregular or faster than normal (palpitations).  Shortness of breath, especially with exercise.  Paleness.  Cold sensitivity.  Indigestion.  Nausea.  Difficulty sleeping.  Difficulty concentrating.  Symptoms may occur suddenly or develop slowly. If your anemia is mild, you may not have symptoms. How is this diagnosed? This condition is diagnosed based on:  Blood tests.  Your medical history.  A physical exam.  Bone marrow biopsy.  Your health care provider may also check your stool (feces) for blood and may do additional testing to look for the cause of your bleeding. You may also have other tests, including:  Imaging tests, such as a CT scan or MRI.  Endoscopy.  Colonoscopy.  How is this treated? Treatment for this condition depends on the cause. If you continue to lose a lot of  blood, you may need to be treated at a hospital. Treatment may include:  Taking supplements of iron, vitamin Z61, or folic acid.  Taking a hormone medicine (erythropoietin) that can help to stimulate red blood cell growth.  Having a blood transfusion. This may be needed if you lose a lot of blood.  Making changes to your diet.  Having surgery to remove your spleen.  Follow these instructions at home:  Take over-the-counter and prescription medicines only as told by your health care provider.  Take supplements only as told by your health care provider.  Follow any diet instructions that you were given.  Keep all follow-up visits as told by your health care provider. This is important. Contact a health care provider if:  You develop new bleeding anywhere in the body. Get help right away if:  You are very weak.  You are short of breath.  You have pain in your abdomen or chest.  You are dizzy or feel faint.  You have trouble concentrating.  You have bloody or black, tarry stools.  You vomit repeatedly or you vomit up blood. Summary  Anemia is a condition in which you do not have enough red blood cells or enough of a substance in your red blood cells that carries oxygen (hemoglobin).  Symptoms may occur suddenly or develop slowly.  If your anemia is mild, you may not have symptoms.  This condition is diagnosed with blood tests as well as a medical history and physical exam. Other tests may be needed.  Treatment for this condition depends on the cause of the anemia. This information is not intended to replace  advice given to you by your health care provider. Make sure you discuss any questions you have with your health care provider. Document Released: 05/20/2004 Document Revised: 05/14/2016 Document Reviewed: 05/14/2016 Elsevier Interactive Patient Education  2018 [employer].  Iron-Rich Diet Iron is a mineral that helps your body to produce hemoglobin. Hemoglobin  is a protein in your red blood cells that carries oxygen to your body's tissues. Eating too little iron may cause you to feel weak and tired, and it can increase your risk for infection. Eating enough iron is necessary for your body's metabolism, muscle function, and nervous system. Iron is naturally found in many foods. It can also be added to foods or fortified in foods. There are two types of dietary iron:  Heme iron. Heme iron is absorbed by the body more easily than nonheme iron. Heme iron is found in meat, poultry, and fish.  Nonheme iron. Nonheme iron is found in dietary supplements, iron-fortified grains, beans, and vegetables.  You may need to follow an iron-rich diet if:  You have been diagnosed with iron deficiency or iron-deficiency anemia.  You have a condition that prevents you from absorbing dietary iron, such as: ? Infection in your intestines. ? Celiac disease. This involves long-lasting (chronic) inflammation of your intestines.  You do not eat enough iron.  You eat a diet that is high in foods that impair iron absorption.  You have lost a lot of blood.  You have heavy bleeding during your menstrual cycle.  You are pregnant.  What is my plan? Your health care provider may help you to determine how much iron you need per day based on your condition. Generally, when a person consumes sufficient amounts of iron in the diet, the following iron needs are met:  Men. ? 37-75 years old: 11 mg per day. ? 45-36 years old: 8 mg per day.  Women. ? 31-66 years old: 15 mg per day. ? 26-26 years old: 18 mg per day. ? Over 73 years old: 8 mg per day. ? Pregnant women: 27 mg per day. ? Breastfeeding women: 9 mg per day.  What do I need to know about an iron-rich diet?  Eat fresh fruits and vegetables that are high in vitamin C along with foods that are high in iron. This will help increase the amount of iron that your body absorbs from food, especially with foods containing  nonheme iron. Foods that are high in vitamin C include oranges, peppers, tomatoes, and mango.  Take iron supplements only as directed by your health care provider. Overdose of iron can be life-threatening. If you were prescribed iron supplements, take them with orange juice or a vitamin C supplement.  Cook foods in pots and pans that are made from iron.  Eat nonheme iron-containing foods alongside foods that are high in heme iron. This helps to improve your iron absorption.  Certain foods and drinks contain compounds that impair iron absorption. Avoid eating these foods in the same meal as iron-rich foods or with iron supplements. These include: ? Coffee, black tea, and red wine. ? Milk, dairy products, and foods that are high in calcium. ? Beans, soybeans, and peas. ? Whole grains.  When eating foods that contain both nonheme iron and compounds that impair iron absorption, follow these tips to absorb iron better. ? Soak beans overnight before cooking. ? Soak whole grains overnight and drain them before using. ? Ferment flours before baking, such as using yeast in bread dough. What foods  can I eat? Grains Iron-fortified breakfast cereal. Iron-fortified whole-wheat bread. Enriched rice. Sprouted grains. Vegetables Spinach. Potatoes with skin. Green peas. Broccoli. Red and green bell peppers. Fermented vegetables. Fruits Prunes. Raisins. Oranges. Strawberries. Mango. Grapefruit. Meats and Other Protein Sources Beef liver. Oysters. Beef. Shrimp. [place]. Chicken. [place]. Sardines. Chickpeas. Nuts. Tofu. Beverages Tomato juice. Fresh orange juice. Prune juice. Hibiscus tea. Fortified instant breakfast shakes. Condiments Tahini. Fermented soy sauce. Sweets and Desserts Black-strap molasses. Other Wheat germ. The items listed above may not be a complete list of recommended foods or beverages. Contact your dietitian for more options. What foods are not recommended? Grains Whole grains.  Bran cereal. Bran flour. Oats. Vegetables Artichokes. Brussels sprouts. Kale. Fruits Blueberries. Raspberries. Strawberries. Figs. Meats and Other Protein Sources Soybeans. Products made from soy protein. Dairy Milk. Cream. Cheese. Yogurt. Cottage cheese. Beverages Coffee. Black tea. Red wine. Sweets and Desserts Cocoa. Chocolate. Ice cream. Other Basil. Oregano. Parsley. The items listed above may not be a complete list of foods and beverages to avoid. Contact your dietitian for more information. This information is not intended to replace advice given to you by your health care provider. Make sure you discuss any questions you have with your health care provider. Document Released: 11/24/2004 Document Revised: 10/31/2015 Document Reviewed: 11/07/2013 Elsevier Interactive Patient Education  [employer].

## 2018-02-03 NOTE — Progress Notes (Signed)
   PRENATAL VISIT NOTE  Subjective:  Jaclyn Tyler is a 27 y.o. [phone] at [redacted]w[redacted]d being seen today for ongoing prenatal care.  She is currently monitored for the following issues for this low-risk pregnancy and has Supervision of low-risk pregnancy; Late prenatal care; and Anemia in pregnancy on their problem list.  Patient reports no complaints.  Contractions: Not present. Vag. Bleeding: None.  Movement: Present. Denies leaking of fluid.  Reports that she was previously interested in a water birth, but is fine with a regular birth now. She had elective inductions at 38 weeks with her prior pregnancies and would like the same with this one.   The following portions of the patient's history were reviewed and updated as appropriate: allergies, current medications, past family history, past medical history, past social history, past surgical history and problem list. Problem list updated.  Objective:   Vitals:   02/03/18 0950  BP: 112/70  Pulse: 90  Weight: 249 lb 6.4 oz (113.1 kg)    Fetal Status: Fetal Heart Rate (bpm): 140 Fundal Height: 28 cm Movement: Present     General:  Alert, oriented and cooperative. Patient is in no acute distress.  Skin: Skin is warm and dry. No rash noted.   Cardiovascular: Normal heart rate noted  Respiratory: Normal respiratory effort, no problems with respiration noted  Abdomen: Soft, gravid, appropriate for gestational age.  Pain/Pressure: Absent     Pelvic: Cervical exam deferred        Extremities: Normal range of motion.  Edema: Trace  Mental Status: Normal mood and affect. Normal behavior. Normal judgment and thought content.   Assessment and Plan:  Pregnancy: [id number] at [redacted]w[redacted]d  1. Anemia during pregnancy in third trimester - counseled on iron-rich diet and encouraged to take PNV daily - CBC today  2. Encounter for supervision of low-risk pregnancy in third trimester - no concerns at today's visit - 3rd trimester labs collected and BTL  consent signed   Preterm labor symptoms and general obstetric precautions including but not limited to vaginal bleeding, contractions, leaking of fluid and fetal movement were reviewed in detail with the patient. Please refer to After Visit Summary for other counseling recommendations.  Return in about 2 weeks (around 02/17/2018) for Routine OB with a midwive.  No future appointments.  Gwenevere Abbot, MD

## 2018-02-04 ENCOUNTER — Other Ambulatory Visit: Payer: Self-pay | Admitting: Advanced Practice Midwife

## 2018-02-04 LAB — GLUCOSE TOLERANCE, 2 HOURS W/ 1HR
GLUCOSE, FASTING: 75 mg/dL (ref 65–91)
Glucose, 1 hour: 90 mg/dL (ref 65–179)
Glucose, 2 hour: 83 mg/dL (ref 65–152)

## 2018-02-04 LAB — RPR: RPR Ser Ql: NONREACTIVE

## 2018-02-04 LAB — HIV ANTIBODY (ROUTINE TESTING W REFLEX): HIV Screen 4th Generation wRfx: NONREACTIVE

## 2018-02-04 LAB — CBC
Hematocrit: 32.7 % — ABNORMAL LOW (ref 34.0–46.6)
Hemoglobin: 10 g/dL — ABNORMAL LOW (ref 11.1–15.9)
MCH: 21.7 pg — AB (ref 26.6–33.0)
MCHC: 30.6 g/dL — AB (ref 31.5–35.7)
MCV: 71 fL — ABNORMAL LOW (ref 79–97)
PLATELETS: 360 10*3/uL (ref 150–450)
RBC: 4.6 x10E6/uL (ref 3.77–5.28)
RDW: 18.1 % — ABNORMAL HIGH (ref 12.3–15.4)
WBC: 12.3 10*3/uL — ABNORMAL HIGH (ref 3.4–10.8)

## 2018-02-04 MED ORDER — DOCUSATE SODIUM 100 MG PO CAPS: 100.0000 mg | ORAL_CAPSULE | Freq: Two times a day (BID) | ORAL | 3 refills | Status: DC | PRN

## 2018-02-04 MED ORDER — FERROUS GLUCONATE 324 (38 FE) MG PO TABS: 324.0000 mg | ORAL_TABLET | Freq: Two times a day (BID) | ORAL | 3 refills | Status: DC

## 2018-02-17 ENCOUNTER — Encounter: Payer: Medicaid Other | Admitting: Student

## 2018-02-21 ENCOUNTER — Ambulatory Visit (INDEPENDENT_AMBULATORY_CARE_PROVIDER_SITE_OTHER): Payer: Medicaid Other | Admitting: Advanced Practice Midwife

## 2018-02-21 VITALS — BP 128/73 | HR 104 | Wt 251.0 lb

## 2018-02-21 DIAGNOSIS — Z3493 Encounter for supervision of normal pregnancy, unspecified, third trimester: Secondary | ICD-10-CM

## 2018-02-21 DIAGNOSIS — O99013 Anemia complicating pregnancy, third trimester: Secondary | ICD-10-CM

## 2018-02-21 NOTE — Patient Instructions (Signed)
Third Trimester of Pregnancy The third trimester is from week 28 through week 40 (months 7 through 9). The third trimester is a time when the unborn baby (fetus) is growing rapidly. At the end of the ninth month, the fetus is about 20 inches in length and weighs 6-10 pounds. Body changes during your third trimester Your body will continue to go through many changes during pregnancy. The changes vary from woman to woman. During the third trimester:  Your weight will continue to increase. You can expect to gain 25-35 pounds (11-16 kg) by the end of the pregnancy.  You may begin to get stretch marks on your hips, abdomen, and breasts.  You may urinate more often because the fetus is moving lower into your pelvis and pressing on your bladder.  You may develop or continue to have heartburn. This is caused by increased hormones that slow down muscles in the digestive tract.  You may develop or continue to have constipation because increased hormones slow digestion and cause the muscles that push waste through your intestines to relax.  You may develop hemorrhoids. These are swollen veins (varicose veins) in the rectum that can itch or be painful.  You may develop swollen, bulging veins (varicose veins) in your legs.  You may have increased body aches in the pelvis, back, or thighs. This is due to weight gain and increased hormones that are relaxing your joints.  You may have changes in your hair. These can include thickening of your hair, rapid growth, and changes in texture. Some women also have hair loss during or after pregnancy, or hair that feels dry or thin. Your hair will most likely return to normal after your baby is born.  Your breasts will continue to grow and they will continue to become tender. A yellow fluid (colostrum) may leak from your breasts. This is the first milk you are producing for your baby.  Your belly button may stick out.  You may notice more swelling in your hands,  face, or ankles.  You may have increased tingling or numbness in your hands, arms, and legs. The skin on your belly may also feel numb.  You may feel short of breath because of your expanding uterus.  You may have more problems sleeping. This can be caused by the size of your belly, increased need to urinate, and an increase in your body's metabolism.  You may notice the fetus "dropping," or moving lower in your abdomen (lightening).  You may have increased vaginal discharge.  You may notice your joints feel loose and you may have pain around your pelvic bone.  What to expect at prenatal visits You will have prenatal exams every 2 weeks until week 36. Then you will have weekly prenatal exams. During a routine prenatal visit:  You will be weighed to make sure you and the baby are growing normally.  Your blood pressure will be taken.  Your abdomen will be measured to track your baby's growth.  The fetal heartbeat will be listened to.  Any test results from the previous visit will be discussed.  You may have a cervical check near your due date to see if your cervix has softened or thinned (effaced).  You will be tested for Group B streptococcus. This happens between 35 and 37 weeks.  Your health care provider may ask you:  What your birth plan is.  How you are feeling.  If you are feeling the baby move.  If you have had   any abnormal symptoms, such as leaking fluid, bleeding, severe headaches, or abdominal cramping.  If you are using any tobacco products, including cigarettes, chewing tobacco, and electronic cigarettes.  If you have any questions.  Other tests or screenings that may be performed during your third trimester include:  Blood tests that check for low iron levels (anemia).  Fetal testing to check the health, activity level, and growth of the fetus. Testing is done if you have certain medical conditions or if there are problems during the  pregnancy.  Nonstress test (NST). This test checks the health of your baby to make sure there are no signs of problems, such as the baby not getting enough oxygen. During this test, a belt is placed around your belly. The baby is made to move, and its heart rate is monitored during movement.  What is false labor? False labor is a condition in which you feel small, irregular tightenings of the muscles in the womb (contractions) that usually go away with rest, changing position, or drinking water. These are called Braxton Hicks contractions. Contractions may last for hours, days, or even weeks before true labor sets in. If contractions come at regular intervals, become more frequent, increase in intensity, or become painful, you should see your health care provider. What are the signs of labor?  Abdominal cramps.  Regular contractions that start at 10 minutes apart and become stronger and more frequent with time.  Contractions that start on the top of the uterus and spread down to the lower abdomen and back.  Increased pelvic pressure and dull back pain.  A watery or bloody mucus discharge that comes from the vagina.  Leaking of amniotic fluid. This is also known as your "water breaking." It could be a slow trickle or a gush. Let your health care provider know if it has a color or strange odor. If you have any of these signs, call your health care provider right away, even if it is before your due date. Follow these instructions at home: Medicines  Follow your health care provider's instructions regarding medicine use. Specific medicines may be either safe or unsafe to take during pregnancy.  Take a prenatal vitamin that contains at least 600 micrograms (mcg) of folic acid.  If you develop constipation, try taking a stool softener if your health care provider approves. Eating and drinking  Eat a balanced diet that includes fresh fruits and vegetables, whole grains, good sources of protein  such as meat, eggs, or tofu, and low-fat dairy. Your health care provider will help you determine the amount of weight gain that is right for you.  Avoid raw meat and uncooked cheese. These carry germs that can cause birth defects in the baby.  If you have low calcium intake from food, talk to your health care provider about whether you should take a daily calcium supplement.  Eat four or five small meals rather than three large meals a day.  Limit foods that are high in fat and processed sugars, such as fried and sweet foods.  To prevent constipation: ? Drink enough fluid to keep your urine clear or pale yellow. ? Eat foods that are high in fiber, such as fresh fruits and vegetables, whole grains, and beans. Activity  Exercise only as directed by your health care provider. Most women can continue their usual exercise routine during pregnancy. Try to exercise for 30 minutes at least 5 days a week. Stop exercising if you experience uterine contractions.  Avoid heavy   lifting.  Do not exercise in extreme heat or humidity, or at high altitudes.  Wear low-heel, comfortable shoes.  Practice good posture.  You may continue to have sex unless your health care provider tells you otherwise. Relieving pain and discomfort  Take frequent breaks and rest with your legs elevated if you have leg cramps or low back pain.  Take warm sitz baths to soothe any pain or discomfort caused by hemorrhoids. Use hemorrhoid cream if your health care provider approves.  Wear a good support bra to prevent discomfort from breast tenderness.  If you develop varicose veins: ? Wear support pantyhose or compression stockings as told by your healthcare provider. ? Elevate your feet for 15 minutes, 3-4 times a day. Prenatal care  Write down your questions. Take them to your prenatal visits.  Keep all your prenatal visits as told by your health care provider. This is important. Safety  Wear your seat belt at  all times when driving.  Make a list of emergency phone numbers, including numbers for family, friends, the hospital, and police and fire departments. General instructions  Avoid cat litter boxes and soil used by cats. These carry germs that can cause birth defects in the baby. If you have a cat, ask someone to clean the litter box for you.  Do not travel far distances unless it is absolutely necessary and only with the approval of your health care provider.  Do not use hot tubs, steam rooms, or saunas.  Do not drink alcohol.  Do not use any products that contain nicotine or tobacco, such as cigarettes and e-cigarettes. If you need help quitting, ask your health care provider.  Do not use any medicinal herbs or unprescribed drugs. These chemicals affect the formation and growth of the baby.  Do not douche or use tampons or scented sanitary pads.  Do not cross your legs for long periods of time.  To prepare for the arrival of your baby: ? Take prenatal classes to understand, practice, and ask questions about labor and delivery. ? Make a trial run to the hospital. ? Visit the hospital and tour the maternity area. ? Arrange for maternity or paternity leave through employers. ? Arrange for family and friends to take care of pets while you are in the hospital. ? Purchase a rear-facing car seat and make sure you know how to install it in your car. ? Pack your hospital bag. ? Prepare the baby's nursery. Make sure to remove all pillows and stuffed animals from the baby's crib to prevent suffocation.  Visit your dentist if you have not gone during your pregnancy. Use a soft toothbrush to brush your teeth and be gentle when you floss. Contact a health care provider if:  You are unsure if you are in labor or if your water has broken.  You become dizzy.  You have mild pelvic cramps, pelvic pressure, or nagging pain in your abdominal area.  You have lower back pain.  You have persistent  nausea, vomiting, or diarrhea.  You have an unusual or bad smelling vaginal discharge.  You have pain when you urinate. Get help right away if:  Your water breaks before 37 weeks.  You have regular contractions less than 5 minutes apart before 37 weeks.  You have a fever.  You are leaking fluid from your vagina.  You have spotting or bleeding from your vagina.  You have severe abdominal pain or cramping.  You have rapid weight loss or weight gain.    You have shortness of breath with chest pain.  You notice sudden or extreme swelling of your face, hands, ankles, feet, or legs.  Your baby makes fewer than 10 movements in 2 hours.  You have severe headaches that do not go away when you take medicine.  You have vision changes. Summary  The third trimester is from week 28 through week 40, months 7 through 9. The third trimester is a time when the unborn baby (fetus) is growing rapidly.  During the third trimester, your discomfort may increase as you and your baby continue to gain weight. You may have abdominal, leg, and back pain, sleeping problems, and an increased need to urinate.  During the third trimester your breasts will keep growing and they will continue to become tender. A yellow fluid (colostrum) may leak from your breasts. This is the first milk you are producing for your baby.  False labor is a condition in which you feel small, irregular tightenings of the muscles in the womb (contractions) that eventually go away. These are called Braxton Hicks contractions. Contractions may last for hours, days, or even weeks before true labor sets in.  Signs of labor can include: abdominal cramps; regular contractions that start at 10 minutes apart and become stronger and more frequent with time; watery or bloody mucus discharge that comes from the vagina; increased pelvic pressure and dull back pain; and leaking of amniotic fluid. This information is not intended to replace advice  given to you by your health care provider. Make sure you discuss any questions you have with your health care provider. Document Released: 04/06/2001 Document Revised: 09/18/2015 Document Reviewed: 06/13/2012 Elsevier Interactive Patient Education  2017 [employer]vier Inc[employer]  

## 2018-02-21 NOTE — Progress Notes (Signed)
   PRENATAL VISIT NOTE  Subjective:  Jaclyn Tyler is a 27 y.o. [phone] at [redacted]w[redacted]d being seen today for ongoing prenatal care.  She is currently monitored for the following issues for this low-risk pregnancy and has Supervision of low-risk pregnancy; Late prenatal care; and Anemia in pregnancy on their problem list.  Patient reports no complaints.  Contractions: Not present. Vag. Bleeding: None.  Movement: Present. Denies leaking of fluid.   The following portions of the patient's history were reviewed and updated as appropriate: allergies, current medications, past family history, past medical history, past social history, past surgical history and problem list. Problem list updated.  Objective:   Vitals:   02/21/18 1548  BP: 128/73  Pulse: (!) 104  Weight: 113.9 kg    Fetal Status: Fetal Heart Rate (bpm): 150   Movement: Present     General:  Alert, oriented and cooperative. Patient is in no acute distress.  Skin: Skin is warm and dry. No rash noted.   Cardiovascular: Normal heart rate noted  Respiratory: Normal respiratory effort, no problems with respiration noted  Abdomen: Soft, gravid, appropriate for gestational age.  Pain/Pressure: Absent     Pelvic: Cervical exam deferred        Extremities: Normal range of motion.  Edema: Trace  Mental Status: Normal mood and affect. Normal behavior. Normal judgment and thought content.   Assessment and Plan:  Pregnancy: [id number] at [redacted]w[redacted]d  1. Anemia during pregnancy in third trimester --Hgb 10.0 at 28 week labs.  Taking PNV daily. Does not tolerate additional iron so not taking.  Feels well, no fatigue, dizziness, or SOB.  2. Encounter for supervision of low-risk pregnancy in third trimester --Anticipatory guidance about next visits/weeks of pregnancy given. 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  Her employer is willing to write her out of work, as there are no light duty options, but needs a  note.  Will provide note that pt particular job is too demanding a this point in her pregnancy.   Preterm labor symptoms and general obstetric precautions including but not limited to vaginal bleeding, contractions, leaking of fluid and fetal movement were reviewed in detail with the patient. Please refer to After Visit Summary for other counseling recommendations.  Return in about 2 weeks (around 03/07/2018).  No future appointments.  Sharen Counter, CNM

## 2018-03-07 ENCOUNTER — Ambulatory Visit (INDEPENDENT_AMBULATORY_CARE_PROVIDER_SITE_OTHER): Payer: Medicaid Other | Admitting: Student

## 2018-03-07 DIAGNOSIS — Z23 Encounter for immunization: Secondary | ICD-10-CM | POA: Diagnosis not present

## 2018-03-07 DIAGNOSIS — Z3493 Encounter for supervision of normal pregnancy, unspecified, third trimester: Secondary | ICD-10-CM

## 2018-03-07 DIAGNOSIS — O99013 Anemia complicating pregnancy, third trimester: Secondary | ICD-10-CM

## 2018-03-07 NOTE — Progress Notes (Signed)
Not taking extra iron because it bothers her stomach; doesn't want red meat but is willing to try increased vegatbles     PRENATAL VISIT NOTE  Subjective:  Jaclyn Tyler is a 27 y.o. [id number] at 4725w3d being seen today for ongoing prenatal care.  She is currently monitored for the following issues for this low-risk pregnancy and has Supervision of low-risk pregnancy; Late prenatal care; and Anemia in pregnancy on their problem list.  Patient reports no complaints. Not taking extra iron because it bothers her stomach; doesn't want red meat but is willing to try increased vegatbles. Contractions: Not present. Vag. Bleeding: None.  Movement: Present. Denies leaking of fluid.   The following portions of the patient's history were reviewed and updated as appropriate: allergies, current medications, past family history, past medical history, past social history, past surgical history and problem list. Problem list updated.  Objective:   Vitals:   03/07/18 1553  BP: 113/64  Pulse: 85  Weight: 252 lb 6.4 oz (114.5 kg)    Fetal Status: Fetal Heart Rate (bpm): 141 Fundal Height: 35 cm Movement: Present     General:  Alert, oriented and cooperative. Patient is in no acute distress.  Skin: Skin is warm and dry. No rash noted.   Cardiovascular: Normal heart rate noted  Respiratory: Normal respiratory effort, no problems with respiration noted  Abdomen: Soft, gravid, appropriate for gestational age.  Pain/Pressure: Absent     Pelvic: Cervical exam deferred        Extremities: Normal range of motion.  Edema: Trace  Mental Status: Normal mood and affect. Normal behavior. Normal judgment and thought content.   Assessment and Plan:  Pregnancy: [id number] at 8325w3d  1. Encounter for supervision of low-risk pregnancy in third trimester -Explained that we do not offer elective inductions at 38 weeks.  - Flu Vaccine QUAD 36+ mos IM  2. Anemia during pregnancy in third trimester -Will redraw labs at  25 weeks, encouraged increase in iron rich foods.  Preterm labor symptoms and general obstetric precautions including but not limited to vaginal bleeding, contractions, leaking of fluid and fetal movement were reviewed in detail with the patient. Please refer to After Visit Summary for other counseling recommendations.  Return in about 2 weeks (around 03/21/2018), or [facility].  Future Appointments  Date Time Provider Department Center  03/21/2018  9:55 AM Crisoforo OxfordKooistra, Charlesetta GaribaldiKathryn Lorraine, CNM [facility]  03/28/2018 10:55 AM Crisoforo OxfordKooistra, Charlesetta GaribaldiKathryn Lorraine, CNM [facility]  04/04/2018  3:35 PM Jaclyn LandKooistra, Alverda Nazzaro Lorraine, CNM [facility]  04/11/2018  3:35 PM Crisoforo OxfordKooistra, Charlesetta GaribaldiKathryn Lorraine, CNM [facility]  04/17/2018 10:35 AM Armando ReichertHogan, Heather D, CNM [facility]  04/24/2018  9:35 AM Armando ReichertHogan, Heather D, CNM [facility]    Jaclyn Tyler, [place]

## 2018-03-07 NOTE — Patient Instructions (Signed)
Postpartum Tubal Ligation Postpartum tubal ligation (PPTL) is a procedure to close the fallopian tubes. This is done so that you cannot get pregnant. When the fallopian tubes are closed, the eggs that the ovaries release cannot enter the uterus, and sperm cannot reach the eggs. PPTL is done right after childbirth or 1-2 days after childbirth, before the uterus returns to its normal location. PPTL is sometimes called "getting your tubes tied." You should not have this procedure if you want to get pregnant someday or if you are unsure about having more children. Tell a health care provider about:  Any allergies you have.  All medicines you are taking, including vitamins, herbs, eye drops, creams, and over-the-counter medicines.  Previous problems you or members of your family have had with the use of anesthetics.  Any blood disorders you have.  Previous surgeries you have had.  Any medical conditions you may have.  Any past pregnancies. What are the risks? Generally, this is a safe procedure. However, problems may occur, including:  Infection.  Bleeding.  Injury to surrounding organs.  Side effects from anesthetics.  Failure of the procedure.  This procedure can increase your risk of a kind of pregnancy in which a fertilized egg attaches to the outside of the uterus (ectopic pregnancy). What happens before the procedure?  Ask your health care provider about: ? How much pain you can expect to have. ? What medicines you will be given for pain, especially if you are planning to breastfeed.  Follow instructions from your health care provider about eating and drinking restrictions. What happens during the procedure? If you had a vaginal delivery:  You may be given one or more of the following: ? A medicine that helps you relax (sedative). ? A medicine to numb the area (local anesthetic). ? A medicine to make you fall asleep (general anesthetic). ? A medicine that is injected  into an area of your body to numb everything below the injection site (regional anesthetic).  If you have been given a general anesthetic, a tube will be put down your throat to help you breathe.  An IV tube will be inserted into one of your veins to give you medicines and fluids during the procedure.  Your bladder may be emptied with a small tube (catheter).  An incision will be made just below your belly button.  Your fallopian tubes will be located and brought up through the incision.  Your fallopian tubes will be tied off, burned (cauterized), or blocked with a clip, ring, or clamp. A small portion in the center of each fallopian tube may be removed.  The incision will be closed with stitches (sutures).  A bandage (dressing) will be placed over the incision.  If you had a cesarean delivery:  Tubal ligation will be done through the incision that was used for the cesarean delivery of your baby.  The incision will be closed with sutures.  A dressing will be placed over the incision.  The procedure may vary among health care providers and hospitals. What happens after the procedure?  Your blood pressure, heart rate, breathing rate, and blood oxygen level will be monitored often until the medicines you were given have worn off.  You will be given pain medicine as needed.  Do not drive for 24 hours if you received a sedative. This information is not intended to replace advice given to you by your health care provider. Make sure you discuss any questions you have with your health   care provider. Document Released: 04/12/2005 Document Revised: 09/15/2015 Document Reviewed: 03/23/2015 Elsevier Interactive Patient Education  2018 Elsevier Inc.  

## 2018-03-21 ENCOUNTER — Ambulatory Visit (INDEPENDENT_AMBULATORY_CARE_PROVIDER_SITE_OTHER): Payer: Medicaid Other | Admitting: Student

## 2018-03-21 ENCOUNTER — Encounter: Payer: Self-pay | Admitting: Student

## 2018-03-21 ENCOUNTER — Encounter: Payer: Medicaid Other | Admitting: Student

## 2018-03-21 VITALS — BP 120/73 | HR 107 | Wt 256.6 lb

## 2018-03-21 DIAGNOSIS — Z3A35 35 weeks gestation of pregnancy: Secondary | ICD-10-CM

## 2018-03-21 DIAGNOSIS — Z3493 Encounter for supervision of normal pregnancy, unspecified, third trimester: Secondary | ICD-10-CM

## 2018-03-21 DIAGNOSIS — O99013 Anemia complicating pregnancy, third trimester: Secondary | ICD-10-CM

## 2018-03-21 NOTE — Patient Instructions (Signed)

## 2018-03-21 NOTE — Progress Notes (Signed)
   PRENATAL VISIT NOTE  Subjective:  Oscar Laenny Boxx is a 27 y.o. [phone] at 1365w3d being seen today for ongoing prenatal care.  She is currently monitored for the following issues for this low-risk pregnancy and has Supervision of low-risk pregnancy; Late prenatal care; and Anemia in pregnancy on their problem list.  Patient reports back pain, Braxton hicks contractions, pelvic pressure. Marland Kitchen. She wants to be induced today.  Contractions: Irregular. Vag. Bleeding: None.  Movement: Present. Denies leaking of fluid.   The following portions of the patient's history were reviewed and updated as appropriate: allergies, current medications, past family history, past medical history, past social history, past surgical history and problem list. Problem list updated.  Objective:   Vitals:   03/21/18 1523  BP: 120/73  Pulse: (!) 107  Weight: 256 lb 9.6 oz (116.4 kg)    Fetal Status: Fetal Heart Rate (bpm): 136 Fundal Height: 36 cm Movement: Present     General:  Alert, oriented and cooperative. Patient is in no acute distress.  Skin: Skin is warm and dry. No rash noted.   Cardiovascular: Normal heart rate noted  Respiratory: Normal respiratory effort, no problems with respiration noted  Abdomen: Soft, gravid, appropriate for gestational age.  Pain/Pressure: Present     Pelvic: Cervical exam deferred        Extremities: Normal range of motion.  Edema: Trace  Mental Status: Normal mood and affect. Normal behavior. Normal judgment and thought content.   Assessment and Plan:  Pregnancy: [id number] at 5465w3d  1. Encounter for supervision of low-risk pregnancy in third trimester -many complaints about being pregnant, wants to be induced.   2. Anemia during pregnancy in third trimester -patient wants to do ferritin and CBC next week  Preterm labor symptoms and general obstetric precautions including but not limited to vaginal bleeding, contractions, leaking of fluid and fetal movement were reviewed  in detail with the patient. Please refer to After Visit Summary for other counseling recommendations.  No follow-ups on file.  Future Appointments  Date Time Provider Department Center  03/28/2018 10:55 AM Marylene LandKooistra, [address]nn Street Lorraine, CNM [facility]  04/04/2018  3:35 PM Crisoforo OxfordKooistra, Charlesetta GaribaldiKathryn Lorraine, CNM [facility]  04/11/2018  3:35 PM Crisoforo OxfordKooistra, Charlesetta GaribaldiKathryn Lorraine, CNM [facility]  04/17/2018 10:35 AM Armando ReichertHogan, Heather D, CNM [facility]  04/24/2018  9:35 AM Armando ReichertHogan, Heather D, CNM [facility]    Marylene LandKathryn Lorraine Mazal Ebey, [place]

## 2018-03-28 ENCOUNTER — Encounter: Payer: Medicaid Other | Admitting: Student

## 2018-03-28 ENCOUNTER — Other Ambulatory Visit (HOSPITAL_COMMUNITY)
Admission: RE | Admit: 2018-03-28 | Discharge: 2018-03-28 | Disposition: A | Discharge status: Home / Self Care | Payer: Medicaid Other | Source: Ambulatory Visit | Attending: Student | Admitting: Student

## 2018-03-28 ENCOUNTER — Ambulatory Visit (INDEPENDENT_AMBULATORY_CARE_PROVIDER_SITE_OTHER): Payer: Medicaid Other | Admitting: Student

## 2018-03-28 VITALS — BP 118/64 | HR 91 | Wt 254.5 lb

## 2018-03-28 DIAGNOSIS — Z3493 Encounter for supervision of normal pregnancy, unspecified, third trimester: Secondary | ICD-10-CM

## 2018-03-28 DIAGNOSIS — O99013 Anemia complicating pregnancy, third trimester: Secondary | ICD-10-CM

## 2018-03-28 NOTE — Progress Notes (Signed)
   PRENATAL VISIT NOTE  Subjective:  Jaclyn Tyler is a 27 y.o. [phone] at 2975w3d being seen today for ongoing prenatal care.  She is currently monitored for the following issues for this low-risk pregnancy and has Supervision of low-risk pregnancy; Late prenatal care; and Anemia in pregnancy on their problem list.  Patient reports still tired of being pregnant. She has a nipple ring that is bothering her; says she cannot get it off. .  Contractions: Irregular. Vag. Bleeding: None.  Movement: Present. Denies leaking of fluid.   The following portions of the patient's history were reviewed and updated as appropriate: allergies, current medications, past family history, past medical history, past social history, past surgical history and problem list. Problem list updated.  Objective:   Vitals:   03/28/18 1454  BP: 118/64  Pulse: 91  Weight: 254 lb 8 oz (115.4 kg)    Fetal Status: Fetal Heart Rate (bpm): 140 Fundal Height: 36 cm Movement: Present  Presentation: Vertex  General:  Alert, oriented and cooperative. Patient is in no acute distress.  Skin: Skin is warm and dry. No rash noted.   Cardiovascular: Normal heart rate noted  Respiratory: Normal respiratory effort, no problems with respiration noted  Abdomen: Soft, gravid, appropriate for gestational age.  Pain/Pressure: Present     Pelvic: Cervical exam performed Dilation: Fingertip      Extremities: Normal range of motion.  Edema: Trace  Mental Status: Normal mood and affect. Normal behavior. Normal judgment and thought content.   Assessment and Plan:  Pregnancy: [id number] at 2675w3d  1. Anemia during pregnancy in third trimester -GBS and GC/C done today -Nipple ring unscrewed and removed without incident.  - CBC - Ferritin  Term labor symptoms and general obstetric precautions including but not limited to vaginal bleeding, contractions, leaking of fluid and fetal movement were reviewed in detail with the patient. Please  refer to After Visit Summary for other counseling recommendations.  Return in about 1 week (around 04/04/2018), or LROB.  Future Appointments  Date Time Provider Department Center  04/04/2018  3:35 PM Madlyn FrankelKooistra, Kathryn Lorraine, [facility]  04/11/2018  3:35 PM Crisoforo OxfordKooistra, Charlesetta GaribaldiKathryn Lorraine, CNM [facility]  04/17/2018  4:15 PM Armando ReichertHogan, Heather D, CNM [facility]  04/24/2018  4:15 PM Armando ReichertHogan, Heather D, CNM [facility]    Marylene LandKathryn Lorraine Kooistra, [place]

## 2018-03-29 LAB — CBC
HEMATOCRIT: 32.9 % — AB (ref 34.0–46.6)
HEMOGLOBIN: 10.1 g/dL — AB (ref 11.1–15.9)
MCH: 21.4 pg — ABNORMAL LOW (ref 26.6–33.0)
MCHC: 30.7 g/dL — ABNORMAL LOW (ref 31.5–35.7)
MCV: 70 fL — ABNORMAL LOW (ref 79–97)
Platelets: 362 10*3/uL (ref 150–450)
RBC: 4.73 x10E6/uL (ref 3.77–5.28)
RDW: 19.1 % — AB (ref 12.3–15.4)
WBC: 12.4 10*3/uL — AB (ref 3.4–10.8)

## 2018-03-29 LAB — GC/CHLAMYDIA PROBE AMP (~~LOC~~) NOT AT ARMC
CHLAMYDIA, DNA PROBE: NEGATIVE
NEISSERIA GONORRHEA: NEGATIVE

## 2018-03-29 LAB — FERRITIN: Ferritin: 11 ng/mL — ABNORMAL LOW (ref 15–150)

## 2018-03-31 LAB — CULTURE, BETA STREP (GROUP B ONLY): Strep Gp B Culture: NEGATIVE

## 2018-04-04 ENCOUNTER — Encounter: Payer: Medicaid Other | Admitting: Student

## 2018-04-11 ENCOUNTER — Ambulatory Visit (INDEPENDENT_AMBULATORY_CARE_PROVIDER_SITE_OTHER): Payer: Medicaid Other | Admitting: Student

## 2018-04-11 VITALS — BP 128/85 | HR 96 | Wt 252.6 lb

## 2018-04-11 DIAGNOSIS — Z3493 Encounter for supervision of normal pregnancy, unspecified, third trimester: Secondary | ICD-10-CM

## 2018-04-11 NOTE — Progress Notes (Signed)
   PRENATAL VISIT NOTE  Subjective:  Jaclyn Tyler is a 27 y.o. [phone] at 5073w3d being seen today for ongoing prenatal care.  She is currently monitored for the following issues for this low-risk pregnancy and has Supervision of low-risk pregnancy; Late prenatal care; and Anemia in pregnancy on their problem list.  Patient reports complaints about being pregnant and wanting to be induced today. .  Contractions: Not present. Vag. Bleeding: None.  Movement: Present. Denies leaking of fluid.   The following portions of the patient's history were reviewed and updated as appropriate: allergies, current medications, past family history, past medical history, past social history, past surgical history and problem list. Problem list updated.  Objective:   Vitals:   04/11/18 1554  BP: 128/85  Pulse: 96  Weight: 252 lb 9.6 oz (114.6 kg)    Fetal Status: Fetal Heart Rate (bpm): 136 Fundal Height: 39 cm Movement: Present     General:  Alert, oriented and cooperative. Patient is in no acute distress.  Skin: Skin is warm and dry. No rash noted.   Cardiovascular: Normal heart rate noted  Respiratory: Normal respiratory effort, no problems with respiration noted  Abdomen: Soft, gravid, appropriate for gestational age.  Pain/Pressure: Present     Pelvic: Cervical exam deferred        Extremities: Normal range of motion.  Edema: None  Mental Status: Normal mood and affect. Normal behavior. Normal judgment and thought content.   Assessment and Plan:  Pregnancy: [id number] at 673w3d  -Patient desires induction; explained to her that we cannot induce today but we can talk about induction at 40 weeks.    Term labor symptoms and general obstetric precautions including but not limited to vaginal bleeding, contractions, leaking of fluid and fetal movement were reviewed in detail with the patient. Please refer to After Visit Summary for other counseling recommendations.  No follow-ups on file.  Future  Appointments  Date Time Provider Department Center  04/17/2018  4:15 PM Eugenio HoesHogan, Heather D, [facility]M [facility]  04/24/2018  4:15 PM Armando ReichertHogan, Heather D, CNM [facility]    Marylene LandKathryn Lorraine Giordano Getman, [place]

## 2018-04-11 NOTE — Patient Instructions (Signed)
Labor Induction Labor induction is when steps are taken to cause a pregnant woman to begin the labor process. Most women go into labor on their own between 37 weeks and 42 weeks of the pregnancy. When this does not happen or when there is a medical need, methods may be used to induce labor. Labor induction causes a pregnant woman's uterus to contract. It also causes the cervix to soften (ripen), open (dilate), and thin out (efface). Usually, labor is not induced before 39 weeks of the pregnancy unless there is a problem with the baby or mother. Before inducing labor, your health care provider will consider a number of factors, including the following:  The medical condition of you and the baby.  How many weeks along you are.  The status of the baby's lung maturity.  The condition of the cervix.  The position of the baby. What are the reasons for labor induction? Labor may be induced for the following reasons:  The health of the baby or mother is at risk.  The pregnancy is overdue by 1 week or more.  The water breaks but labor does not start on its own.  The mother has a health condition or serious illness, such as high blood pressure, infection, placental abruption, or diabetes.  The amniotic fluid amounts are low around the baby.  The baby is distressed. Convenience or wanting the baby to be born on a certain date is not a reason for inducing labor. What methods are used for labor induction? Several methods of labor induction may be used, such as:  Prostaglandin medicine. This medicine causes the cervix to dilate and ripen. The medicine will also start contractions. It can be taken by mouth or by inserting a suppository into the vagina.  Inserting a thin tube (catheter) with a balloon on the end into the vagina to dilate the cervix. Once inserted, the balloon is expanded with water, which causes the cervix to open.  Stripping the membranes. Your health care provider separates  amniotic sac tissue from the cervix, causing the cervix to be stretched and causing the release of a hormone called progesterone. This may cause the uterus to contract. It is often done during an office visit. You will be sent home to wait for the contractions to begin. You will then come in for an induction.  Breaking the water. Your health care provider makes a hole in the amniotic sac using a small instrument. Once the amniotic sac breaks, contractions should begin. This may still take hours to see an effect.  Medicine to trigger or strengthen contractions. This medicine is given through an IV access tube inserted into a vein in your arm. All of the methods of induction, besides stripping the membranes, will be done in the hospital. Induction is done in the hospital so that you and the baby can be carefully monitored. How long does it take for labor to be induced? Some inductions can take up to 2-3 days. Depending on the cervix, it usually takes less time. It takes longer when you are induced early in the pregnancy or if this is your first pregnancy. If a mother is still pregnant and the induction has been going on for 2-3 days, either the mother will be sent home or a cesarean delivery will be needed. What are the risks associated with labor induction? Some of the risks of induction include:  Changes in fetal heart rate, such as too high, too low, or erratic.  Fetal distress.    Chance of infection for the mother and baby.  Increased chance of having a cesarean delivery.  Breaking off (abruption) of the placenta from the uterus (rare).  Uterine rupture (very rare). When induction is needed for medical reasons, the benefits of induction may outweigh the risks. What are some reasons for not inducing labor? Labor induction should not be done if:  It is shown that your baby does not tolerate labor.  You have had previous surgeries on your uterus, such as a myomectomy or the removal of  fibroids.  Your placenta lies very low in the uterus and blocks the opening of the cervix (placenta previa).  Your baby is not in a head-down position.  The umbilical cord drops down into the birth canal in front of the baby. This could cut off the baby's blood and oxygen supply.  You have had a previous cesarean delivery.  There are unusual circumstances, such as the baby being extremely premature. This information is not intended to replace advice given to you by your health care provider. Make sure you discuss any questions you have with your health care provider. Document Released: 09/01/2006 Document Revised: 09/18/2015 Document Reviewed: 11/09/2012 Elsevier Interactive Patient Education  2017 Elsevier Inc.  

## 2018-04-17 ENCOUNTER — Ambulatory Visit (INDEPENDENT_AMBULATORY_CARE_PROVIDER_SITE_OTHER): Payer: Medicaid Other | Admitting: Advanced Practice Midwife

## 2018-04-17 ENCOUNTER — Encounter: Payer: Self-pay | Admitting: Advanced Practice Midwife

## 2018-04-17 ENCOUNTER — Encounter: Payer: Medicaid Other | Admitting: Advanced Practice Midwife

## 2018-04-17 VITALS — BP 115/69 | HR 96 | Wt 255.1 lb

## 2018-04-17 DIAGNOSIS — Z3493 Encounter for supervision of normal pregnancy, unspecified, third trimester: Secondary | ICD-10-CM

## 2018-04-17 NOTE — Patient Instructions (Signed)
Vaginal delivery means that you give birth by pushing your baby out of your birth canal (vagina). A team of health care providers will help you before, during, and after vaginal delivery. Birth experiences are unique for every woman and every pregnancy, and birth experiences vary depending on where you choose to give birth. What happens when I arrive at the birth center or hospital? Once you are in labor and have been admitted into the hospital or birth center, your health care provider may:  Review your pregnancy history and any concerns that you have.  Insert an IV into one of your veins. This may be used to give you fluids and medicines.  Check your blood pressure, pulse, temperature, and heart rate (vital signs).  Check whether your bag of water (amniotic sac) has broken (ruptured).  Talk with you about your birth plan and discuss pain control options. Monitoring Your health care provider may monitor your contractions (uterine monitoring) and your baby's heart rate (fetal monitoring). You may need to be monitored:  Often, but not continuously (intermittently).  All the time or for long periods at a time (continuously). Continuous monitoring may be needed if: ? You are taking certain medicines, such as medicine to relieve pain or make your contractions stronger. ? You have pregnancy or labor complications. Monitoring may be done by:  Placing a special stethoscope or a handheld monitoring device on your abdomen to check your baby's heartbeat and to check for contractions.  Placing monitors on your abdomen (external monitors) to record your baby's heartbeat and the frequency and length of contractions.  Placing monitors inside your uterus through your vagina (internal monitors) to record your baby's heartbeat and the frequency, length, and strength of your contractions. Depending on the type of monitor, it may remain in your uterus or on your baby's head until birth.  Telemetry. This is  a type of continuous monitoring that can be done with external or internal monitors. Instead of having to stay in bed, you are able to move around during telemetry. Physical exam Your health care provider may perform frequent physical exams. This may include:  Checking how and where your baby is positioned in your uterus.  Checking your cervix to determine: ? Whether it is thinning out (effacing). ? Whether it is opening up (dilating). What happens during labor and delivery?  Normal labor and delivery is divided into the following three stages: Stage 1  This is the longest stage of labor.  This stage can last for hours or days.  Throughout this stage, you will feel contractions. Contractions generally feel mild, infrequent, and irregular at first. They get stronger, more frequent (about every 2-3 minutes), and more regular as you move through this stage.  This stage ends when your cervix is completely dilated to 4 inches (10 cm) and completely effaced. Stage 2  This stage starts once your cervix is completely effaced and dilated and lasts until the delivery of your baby.  This stage may last from 20 minutes to 2 hours.  This is the stage where you will feel an urge to push your baby out of your vagina.  You may feel stretching and burning pain, especially when the widest part of your baby's head passes through the vaginal opening (crowning).  Once your baby is delivered, the umbilical cord will be clamped and cut. This usually occurs after waiting a period of 1-2 minutes after delivery.  Your baby will be placed on your bare chest (skin-to-skin contact) in   an upright position and covered with a warm blanket. Watch your baby for feeding cues, like rooting or sucking, and help the baby to your breast for his or her first feeding. Stage 3  This stage starts immediately after the birth of your baby and ends after you deliver the placenta.  This stage may take anywhere from 5 to 30  minutes.  After your baby has been delivered, you will feel contractions as your body expels the placenta and your uterus contracts to control bleeding. What can I expect after labor and delivery?  After labor is over, you and your baby will be monitored closely until you are ready to go home to ensure that you are both healthy. Your health care team will teach you how to care for yourself and your baby.  You and your baby will stay in the same room (rooming in) during your hospital stay. This will encourage early bonding and successful breastfeeding.  You may continue to receive fluids and medicines through an IV.  Your uterus will be checked and massaged regularly (fundal massage).  You will have some soreness and pain in your abdomen, vagina, and the area of skin between your vaginal opening and your anus (perineum).  If an incision was made near your vagina (episiotomy) or if you had some vaginal tearing during delivery, cold compresses may be placed on your episiotomy or your tear. This helps to reduce pain and swelling.  You may be given a squirt bottle to use instead of wiping when you go to the bathroom. To use the squirt bottle, follow these steps: ? Before you urinate, fill the squirt bottle with warm water. Do not use hot water. ? After you urinate, while you are sitting on the toilet, use the squirt bottle to rinse the area around your urethra and vaginal opening. This rinses away any urine and blood. ? Fill the squirt bottle with clean water every time you use the bathroom.  It is normal to have vaginal bleeding after delivery. Wear a sanitary pad for vaginal bleeding and discharge. Summary  Vaginal delivery means that you will give birth by pushing your baby out of your birth canal (vagina).  Your health care provider may monitor your contractions (uterine monitoring) and your baby's heart rate (fetal monitoring).  Your health care provider may perform a physical  exam.  Normal labor and delivery is divided into three stages.  After labor is over, you and your baby will be monitored closely until you are ready to go home. This information is not intended to replace advice given to you by your health care provider. Make sure you discuss any questions you have with your health care provider. Document Released: 01/20/2008 Document Revised: 05/17/2017 Document Reviewed: 05/17/2017 Elsevier Interactive Patient Education  2019 Elsevier Inc.  

## 2018-04-17 NOTE — Progress Notes (Signed)
   PRENATAL VISIT NOTE  Subjective:  Jaclyn Tyler is a 27 y.o. [phone] at 6770w2d being seen today for ongoing prenatal care.  She is currently monitored for the following issues for this low-risk pregnancy and has Supervision of low-risk pregnancy; Late prenatal care; and Anemia in pregnancy on their problem list.  Patient reports no complaints.  Contractions: Not present. Vag. Bleeding: None.  Movement: Present. Denies leaking of fluid.   Patient requesting to have membranes swept.    The following portions of the patient's history were reviewed and updated as appropriate: allergies, current medications, past family history, past medical history, past social history, past surgical history and problem list. Problem list updated.  Objective:   Vitals:   04/17/18 1630  BP: 115/69  Pulse: 96  Weight: 255 lb 1.6 oz (115.7 kg)    Fetal Status: Fetal Heart Rate (bpm): 142   Movement: Present  Presentation: Vertex  General:  Alert, oriented and cooperative. Patient is in no acute distress.  Skin: Skin is warm and dry. No rash noted.   Cardiovascular: Normal heart rate noted  Respiratory: Normal respiratory effort, no problems with respiration noted  Abdomen: Soft, gravid, appropriate for gestational age.  Pain/Pressure: Absent     Pelvic: Cervical exam performed Dilation: 2.5 Effacement (%): 50 Station: -3  Extremities: Normal range of motion.  Edema: None  Mental Status: Normal mood and affect. Normal behavior. Normal judgment and thought content.   Assessment and Plan:  Pregnancy: [id number] at 6470w2d  1. Encounter for supervision of low-risk pregnancy in third trimester - Routine care - BPP/NST at next visit - IOL scheduled for 04/29/17 at 0800- orders placed  - Membranes stripped per patient request.    Term labor symptoms and general obstetric precautions including but not limited to vaginal bleeding, contractions, leaking of fluid and fetal movement were reviewed in detail with  the patient. Please refer to After Visit Summary for other counseling recommendations.  Return in 1 week (on 04/24/2018) for Postdates BPP/NST at next visit .  Future Appointments  Date Time Provider Department Center  04/24/2018  4:15 PM Armando ReichertHogan, Heather D, CNM [facility]  04/29/2018  8:00 AM [facility] ROOM [facility] None    Thressa ShellerHeather Hogan, CNM

## 2018-04-18 ENCOUNTER — Other Ambulatory Visit: Payer: Self-pay | Admitting: Student

## 2018-04-22 ENCOUNTER — Inpatient Hospital Stay (HOSPITAL_COMMUNITY)
Admission: AD | Admit: 2018-04-22 | Discharge: 2018-04-24 | DRG: 798 | Disposition: A | Discharge status: Home / Self Care | Payer: Medicaid Other | Attending: Family Medicine | Admitting: Family Medicine

## 2018-04-22 ENCOUNTER — Encounter (HOSPITAL_COMMUNITY): Payer: Self-pay | Admitting: *Deleted

## 2018-04-22 ENCOUNTER — Inpatient Hospital Stay (HOSPITAL_COMMUNITY): Payer: Medicaid Other | Admitting: Anesthesiology

## 2018-04-22 ENCOUNTER — Other Ambulatory Visit: Payer: Self-pay

## 2018-04-22 DIAGNOSIS — O480 Post-term pregnancy: Principal | ICD-10-CM | POA: Diagnosis present

## 2018-04-22 DIAGNOSIS — O99019 Anemia complicating pregnancy, unspecified trimester: Secondary | ICD-10-CM | POA: Diagnosis present

## 2018-04-22 DIAGNOSIS — O9902 Anemia complicating childbirth: Secondary | ICD-10-CM | POA: Diagnosis present

## 2018-04-22 DIAGNOSIS — D649 Anemia, unspecified: Secondary | ICD-10-CM | POA: Diagnosis present

## 2018-04-22 DIAGNOSIS — Z3493 Encounter for supervision of normal pregnancy, unspecified, third trimester: Secondary | ICD-10-CM

## 2018-04-22 DIAGNOSIS — Z302 Encounter for sterilization: Secondary | ICD-10-CM | POA: Diagnosis not present

## 2018-04-22 DIAGNOSIS — Z3A40 40 weeks gestation of pregnancy: Secondary | ICD-10-CM

## 2018-04-22 DIAGNOSIS — O0930 Supervision of pregnancy with insufficient antenatal care, unspecified trimester: Secondary | ICD-10-CM

## 2018-04-22 DIAGNOSIS — O99013 Anemia complicating pregnancy, third trimester: Secondary | ICD-10-CM

## 2018-04-22 LAB — CBC
HCT: 35.6 % — ABNORMAL LOW (ref 36.0–46.0)
Hemoglobin: 10.9 g/dL — ABNORMAL LOW (ref 12.0–15.0)
MCH: 21.9 pg — AB (ref 26.0–34.0)
MCHC: 30.6 g/dL (ref 30.0–36.0)
MCV: 71.5 fL — ABNORMAL LOW (ref 80.0–100.0)
Platelets: 357 10*3/uL (ref 150–400)
RBC: 4.98 MIL/uL (ref 3.87–5.11)
RDW: 20.3 % — AB (ref 11.5–15.5)
WBC: 11.8 10*3/uL — ABNORMAL HIGH (ref 4.0–10.5)
nRBC: 0 % (ref 0.0–0.2)

## 2018-04-22 LAB — TYPE AND SCREEN
ABO/RH(D): A POS
Antibody Screen: NEGATIVE

## 2018-04-22 MED ORDER — OXYCODONE-ACETAMINOPHEN 5-325 MG PO TABS: 1.0000 | ORAL_TABLET | ORAL | Status: DC | PRN

## 2018-04-22 MED ORDER — DIPHENHYDRAMINE HCL 50 MG/ML IJ SOLN: 12.5000 mg | INTRAMUSCULAR | Status: DC | PRN

## 2018-04-22 MED ORDER — LACTATED RINGERS IV SOLN: 500.0000 mL | Freq: Once | INTRAVENOUS | Status: DC

## 2018-04-22 MED ORDER — ONDANSETRON HCL 4 MG/2ML IJ SOLN
4.0000 mg | INTRAMUSCULAR | Status: DC | PRN
  Administered 2018-04-23: 4 mg via INTRAVENOUS

## 2018-04-22 MED ORDER — FENTANYL 2.5 MCG/ML BUPIVACAINE 1/10 % EPIDURAL INFUSION (WH - ANES)
14.0000 mL/h | INTRAMUSCULAR | Status: DC | PRN
  Administered 2018-04-22: 14 mL/h via EPIDURAL
  Filled 2018-04-22: qty 100

## 2018-04-22 MED ORDER — TETANUS-DIPHTH-ACELL PERTUSSIS 5-2.5-18.5 LF-MCG/0.5 IM SUSP: 0.5000 mL | Freq: Once | INTRAMUSCULAR | Status: DC

## 2018-04-22 MED ORDER — LACTATED RINGERS IV SOLN: 500.0000 mL | INTRAVENOUS | Status: DC | PRN

## 2018-04-22 MED ORDER — FENTANYL CITRATE (PF) 100 MCG/2ML IJ SOLN
INTRAMUSCULAR | Status: AC
  Filled 2018-04-22: qty 2

## 2018-04-22 MED ORDER — WITCH HAZEL-GLYCERIN EX PADS: 1.0000 "application " | MEDICATED_PAD | CUTANEOUS | Status: DC | PRN

## 2018-04-22 MED ORDER — SOD CITRATE-CITRIC ACID 500-334 MG/5ML PO SOLN: 30.0000 mL | ORAL | Status: DC | PRN

## 2018-04-22 MED ORDER — OXYTOCIN 40 UNITS IN LACTATED RINGERS INFUSION - SIMPLE MED
2.5000 [IU]/h | INTRAVENOUS | Status: DC
  Filled 2018-04-22: qty 1000

## 2018-04-22 MED ORDER — OXYTOCIN BOLUS FROM INFUSION
500.0000 mL | Freq: Once | INTRAVENOUS | Status: AC
  Administered 2018-04-22: 500 mL via INTRAVENOUS

## 2018-04-22 MED ORDER — EPHEDRINE 5 MG/ML INJ
10.0000 mg | INTRAVENOUS | Status: DC | PRN
  Filled 2018-04-22: qty 2

## 2018-04-22 MED ORDER — OXYCODONE-ACETAMINOPHEN 5-325 MG PO TABS: 2.0000 | ORAL_TABLET | ORAL | Status: DC | PRN

## 2018-04-22 MED ORDER — SENNOSIDES-DOCUSATE SODIUM 8.6-50 MG PO TABS
2.0000 | ORAL_TABLET | ORAL | Status: DC
  Administered 2018-04-23: 2 via ORAL
  Filled 2018-04-22: qty 2

## 2018-04-22 MED ORDER — SIMETHICONE 80 MG PO CHEW: 80.0000 mg | CHEWABLE_TABLET | ORAL | Status: DC | PRN

## 2018-04-22 MED ORDER — ACETAMINOPHEN 325 MG PO TABS: 650.0000 mg | ORAL_TABLET | ORAL | Status: DC | PRN

## 2018-04-22 MED ORDER — DIBUCAINE 1 % RE OINT: 1.0000 "application " | TOPICAL_OINTMENT | RECTAL | Status: DC | PRN

## 2018-04-22 MED ORDER — COCONUT OIL OIL: 1.0000 "application " | TOPICAL_OIL | Status: DC | PRN

## 2018-04-22 MED ORDER — MISOPROSTOL 50MCG HALF TABLET: 50.0000 ug | ORAL_TABLET | ORAL | Status: DC | PRN

## 2018-04-22 MED ORDER — ACETAMINOPHEN 325 MG PO TABS
650.0000 mg | ORAL_TABLET | ORAL | Status: DC | PRN
  Administered 2018-04-23 (×4): 650 mg via ORAL
  Filled 2018-04-22 (×4): qty 2

## 2018-04-22 MED ORDER — LACTATED RINGERS IV SOLN
INTRAVENOUS | Status: DC
  Administered 2018-04-22: 17:00:00 via INTRAVENOUS

## 2018-04-22 MED ORDER — BENZOCAINE-MENTHOL 20-0.5 % EX AERO
1.0000 "application " | INHALATION_SPRAY | CUTANEOUS | Status: DC | PRN
  Administered 2018-04-23: 1 via TOPICAL
  Filled 2018-04-22: qty 56

## 2018-04-22 MED ORDER — PHENYLEPHRINE 40 MCG/ML (10ML) SYRINGE FOR IV PUSH (FOR BLOOD PRESSURE SUPPORT)
80.0000 ug | PREFILLED_SYRINGE | INTRAVENOUS | Status: DC | PRN
  Administered 2018-04-22: 80 ug via INTRAVENOUS
  Filled 2018-04-22 (×2): qty 10

## 2018-04-22 MED ORDER — DIPHENHYDRAMINE HCL 25 MG PO CAPS: 25.0000 mg | ORAL_CAPSULE | Freq: Four times a day (QID) | ORAL | Status: DC | PRN

## 2018-04-22 MED ORDER — PRENATAL MULTIVITAMIN CH
1.0000 | ORAL_TABLET | Freq: Every day | ORAL | Status: DC
  Administered 2018-04-24: 1 via ORAL
  Filled 2018-04-22: qty 1

## 2018-04-22 MED ORDER — FENTANYL CITRATE (PF) 100 MCG/2ML IJ SOLN
100.0000 ug | INTRAMUSCULAR | Status: DC | PRN
  Administered 2018-04-22: 100 ug via INTRAVENOUS

## 2018-04-22 MED ORDER — ONDANSETRON HCL 4 MG PO TABS: 4.0000 mg | ORAL_TABLET | ORAL | Status: DC | PRN

## 2018-04-22 MED ORDER — PHENYLEPHRINE 40 MCG/ML (10ML) SYRINGE FOR IV PUSH (FOR BLOOD PRESSURE SUPPORT)
80.0000 ug | PREFILLED_SYRINGE | INTRAVENOUS | Status: DC | PRN
  Filled 2018-04-22: qty 10

## 2018-04-22 MED ORDER — OXYTOCIN 40 UNITS IN LACTATED RINGERS INFUSION - SIMPLE MED: 1.0000 m[IU]/min | INTRAVENOUS | Status: DC

## 2018-04-22 MED ORDER — TERBUTALINE SULFATE 1 MG/ML IJ SOLN
0.2500 mg | Freq: Once | INTRAMUSCULAR | Status: DC | PRN
  Filled 2018-04-22: qty 1

## 2018-04-22 MED ORDER — ONDANSETRON HCL 4 MG/2ML IJ SOLN: 4.0000 mg | Freq: Four times a day (QID) | INTRAMUSCULAR | Status: DC | PRN

## 2018-04-22 MED ORDER — VITAMIN K1 1 MG/0.5ML IJ SOLN
INTRAMUSCULAR | Status: AC
  Filled 2018-04-22: qty 0.5

## 2018-04-22 MED ORDER — IBUPROFEN 600 MG PO TABS
600.0000 mg | ORAL_TABLET | Freq: Four times a day (QID) | ORAL | Status: DC
  Administered 2018-04-23 – 2018-04-24 (×7): 600 mg via ORAL
  Filled 2018-04-22 (×9): qty 1

## 2018-04-22 MED ORDER — LIDOCAINE HCL (PF) 1 % IJ SOLN
30.0000 mL | INTRAMUSCULAR | Status: DC | PRN
  Filled 2018-04-22: qty 30

## 2018-04-22 MED ORDER — LIDOCAINE-EPINEPHRINE (PF) 2 %-1:200000 IJ SOLN
INTRAMUSCULAR | Status: DC | PRN
  Administered 2018-04-22: 3 mL via EPIDURAL
  Administered 2018-04-22: 4 mL via EPIDURAL

## 2018-04-22 NOTE — Progress Notes (Signed)
Attempted to use stedy to assist pt to bathroom. Pt became dizzy, pale, and clammy. Placed pt back in bed, BP 116/59. WNL for this pt. I&O catheter done with of cloudy yellow urine noted. Fundus firm at one below, bleeding small. Pt states 'I feel better now". Will continue to monitor until transfer.

## 2018-04-22 NOTE — Discharge Summary (Addendum)
Postpartum Discharge Summary  Patient Name: Jaclyn Tyler DOB: 11/19/1990 MRN: [id number]  Date of admission: 04/22/2018 Delivering Provider: Henderson NewcomerWILSON, BRIDGID H   Date of discharge: 04/24/2018  Admitting diagnosis: 40 WKS, LABOR CHECK, SPOTTING Intrauterine pregnancy: 6527w0d     Secondary diagnosis:  Active Problems:   Late prenatal care   Anemia in pregnancy   Post-dates pregnancy  Additional problems: none     Discharge diagnosis: Term Pregnancy Delivered and status post postpartum tubal ligation                                      Post partum procedures:postpartum tubal ligation Augmentation: None Complications: None  Hospital course:  Onset of Labor With Vaginal Delivery     27 y.o. yo [id number] at 6527w0d was admitted in Active Labor on 04/22/2018. Patient had an uncomplicated labor course as follows:  Membrane Rupture Time/Date: 3:37 PM ,04/22/2018   Intrapartum Procedures: Episiotomy: None [1]                                         Lacerations:  None [1]  Patient had a delivery of a Viable infant. 04/22/2018  Information for the patient's newborn:  Minta BalsamSingletary, Jaclyn Boyd Kerbsenny [[id number]][[id number]]  Delivery Method: Vaginal, Spontaneous(Filed from Delivery Summary)   Pateint had an uncomplicated postpartum course.  She is ambulating, tolerating a regular diet, passing flatus, and urinating well. Patient is discharged home in stable condition on 04/24/18. She was discharged home with ibuprofen and oxycodone for prn pain control after BTL.  Magnesium Sulfate recieved: No BMZ received: No  Physical exam  Vitals:   04/23/18 1447 04/23/18 1705 04/23/18 2100 04/24/18 0633  BP: 111/70 115/69 119/68 107/74  Pulse: 77 80 70 79  Resp: 20 20 18 19   Temp: 98.8 F (37.1 C) 99.7 F (37.6 C)  98.7 F (37.1 C)  TempSrc:    Oral  SpO2:   100% 100%  Weight:       General: alert, cooperative and no distress Lochia: appropriate Uterine Fundus: firm Incision: N/A DVT Evaluation: No evidence  of DVT seen on physical exam. Labs: Lab Results  Component Value Date   WBC 11.8 (H) 04/22/2018   HGB 10.9 (L) 04/22/2018   HCT 35.6 (L) 04/22/2018   MCV 71.5 (L) 04/22/2018   PLT 357 04/22/2018   CMP Latest Ref Rng & Units 07/26/2016  Glucose 65 - 99 mg/dL 84  BUN 6 - 20 mg/dL 7  Creatinine 6.960.44 - 2.951.00 mg/dL 2.840.72  Sodium 132135 - 440145 mmol/L 135  Potassium 3.5 - 5.1 mmol/L 3.8  Chloride 101 - 111 mmol/L 103  CO2 22 - 32 mmol/L 23  Calcium 8.9 - 10.3 mg/dL 1.0(U8.7(L)  Total Protein 6.5 - 8.1 g/dL 7.5  Total Bilirubin 0.3 - 1.2 mg/dL 0.6  Alkaline Phos 38 - 126 U/L 112  AST 15 - 41 U/L 18  ALT 14 - 54 U/L 14    Discharge instruction: per After Visit Summary and "Baby and Me Booklet".  After visit meds:  Allergies as of 04/24/2018      Reactions   Mushroom Extract Complex Hives   Other Hives   Pt is allergic to paprika seasoning      Medication List    STOP taking these medications   docusate sodium 100 MG capsule  Commonly known as:  COLACE     TAKE these medications   acetaminophen 325 MG tablet Commonly known as:  TYLENOL Take 2 tablets (650 mg total) by mouth every 4 (four) hours as needed (for pain scale < 4).   ferrous gluconate 324 MG tablet Commonly known as:  FERGON Take 1 tablet (324 mg total) by mouth 2 (two) times daily with a meal.   ibuprofen 600 MG tablet Commonly known as:  ADVIL,MOTRIN Take 1 tablet (600 mg total) by mouth every 6 (six) hours.   oxyCODONE-acetaminophen 5-325 MG tablet Commonly known as:  PERCOCET/ROXICET Take 1-2 tablets by mouth every 4 (four) hours as needed for moderate pain or severe pain.   Prenatal Vitamins 0.8 MG tablet Take 1 tablet by mouth daily.   senna-docusate 8.6-50 MG tablet Commonly known as:  Senokot-S Take 2 tablets by mouth daily.   simethicone 80 MG chewable tablet Commonly known as:  MYLICON Chew 1 tablet (80 mg total) by mouth as needed for flatulence.            Discharge Care Instructions  (From  admission, onward)         Start     Ordered   04/24/18 0000  Discharge wound care:    Comments:  Leave dressing on for 7 days   04/24/18 0935          Diet: routine diet  Activity: Advance as tolerated. Pelvic rest for 6 weeks.   Outpatient follow up:4 weeks, instructed to schedule appointment in 4-6 weeks.  Follow up Visit:  Please schedule this patient for Postpartum visit in: 4 weeks with the following provider: Any provider For C/S patients schedule nurse incision check in weeks 2 weeks: no Low risk pregnancy complicated by: late prenatal care, THC use Delivery mode:  SVD Anticipated Birth Control:  BTL done PP PP Procedures needed: none  Schedule Integrated BH visit: no  Newborn Data: Live born female  Birth Weight:   APGAR: 9, 9  Newborn Delivery   Birth date/time:  04/22/2018 18:25:00 Delivery type:  Vaginal, Spontaneous     Baby Feeding: Breast (expressed) Disposition:home with mother  Henderson NewcomerBridgid H Wilson, MD  Attestation: I have seen this patient and agree with the resident's documentation. I have examined them separately, and we have discussed the plan of care.  Cristal DeerLaurel S. Earlene PlaterWallace, DO OB/GYN Fellow

## 2018-04-22 NOTE — Progress Notes (Signed)
Pt was very agitated and angry following the epidural placement.  Pt would not sit still for BP to be taken.  All blood pressures taken during this time were taken on alternating arms but were unreliable due to patient's behavior.

## 2018-04-22 NOTE — H&P (Addendum)
LABOR AND DELIVERY ADMISSION HISTORY AND PHYSICAL NOTE  Jaclyn Tyler is a 27 y.o. female [phone] with IUP at 3866w0d by 17 week US presenting for SOL. Contractions have been occurring since Monday when she had her membranes stripped, then started to become more painful and frequent throughout the day today. She has also noted some spotting, but no bleeding.   She reports positive fetal movement. She denies leakage of fluid or vaginal bleeding.  Pren[facility] History/Complications[facility] Pregnancy complications:  - late prenatal care - renal pyelectasis and choroid plexus cyst noted on US  Past Medical History: Past Medical History:  Diagnosis Date  . Anemia   . Constipation   . GERD (gastroesophageal reflux disease)   . Gonorrhea     Past Surgical History: Past Surgical History:  Procedure Laterality Date  . WISDOM TOOTH EXTRACTION      Obstetrical History: OB History    Gravida  6   Para  3   Term  3   Preterm  0   AB  2   Living  3     SAB  2   TAB  0   Ectopic  0   Multiple  0   Live Births  3           Social History: Social History   Socioeconomic History  . Marital status: Single    Spouse name: Not on file  . Number of children: Not on file  . Years of education: Not on file  . Highest education level: Not on file  Occupational History  . Not on file  Social Needs  . Financial resource strain: Not on file  . Food insecurity:    Worry: Not on file    Inability: Not on file  . Transportation needs:    Medical: Not on file    Non-medical: Not on file  Tobacco Use  . Smoking status: Never Smoker  . Smokeless tobacco: Never Used  Substance and Sexual Activity  . Alcohol use: Not Currently  . Drug use: Not Currently  . Sexual activity: Yes    Birth control/protection: None  Lifestyle  . Physical activity:    Days per week: Not on file    Minutes per session: Not on file  . Stress: Not on file  Relationships  . Social  connections:    Talks on phone: Not on file    Gets together: Not on file    Attends religious service: Not on file    Active member of club or organization: Not on file    Attends meetings of clubs or organizations: Not on file    Relationship status: Not on file  Other Topics Concern  . Not on file  Social History Narrative  . Not on file    Family History: History reviewed. No pertinent family history.  Allergies: Allergies  Allergen Reactions  . Mushroom Extract Complex Hives  . Other Hives    Pt is allergic to paprika seasoning    Medications Prior to Admission  Medication Sig Dispense Refill Last Dose  . docusate sodium (COLACE) 100 MG capsule Take 1 capsule (100 mg total) by mouth 2 (two) times daily as needed for mild constipation. (Patient not taking: Reported on 04/11/2018) 30 capsule 3 Not Taking  . ferrous gluconate (FERGON) 324 MG tablet Take 1 tablet (324 mg total) by mouth 2 (two) times daily with a meal. (Patient not taking: Reported on 03/28/2018) 60 tablet 3 Not  Taking  . Prenatal Multivit-Min-Fe-FA (PRENATAL VITAMINS) 0.8 MG tablet Take 1 tablet by mouth daily. 30 tablet 12 Taking  . Prenatal Vit-Fe Fumarate-FA (PRENATAL MULTIVITAMIN) TABS tablet Take 2 tablets by mouth daily at 12 noon.   Taking     Review of Systems  All systems reviewed and negative except as stated in HPI  Physical Exam Blood pressure 121/75, pulse 100, temperature 98.1 F (36.7 C), temperature source Oral, resp. rate 18, weight 114.8 kg, last menstrual period 07/27/2017, SpO2 99 %, unknown if currently breastfeeding. General appearance: alert, oriented, NAD Lungs: normal respiratory effort Heart: regular rate Abdomen: soft, non-tender; gravid, FH appropriate for GA Extremities: No calf swelling or tenderness Presentation: cephalic Fetal monitoring: baseline HR 130, mod variability, +accel, -decel Uterine activity: not graphing well on toco Dilation: 4.5 Effacement (%): 50,  60 Station: -2 Exam by:: K.Wilson,RN  Prenatal labs: ABO, Rh: A/Positive/-- (08/02 1042) Antibody: Negative (08/02 1042) Rubella: 3.46 (08/02 1042) RPR: Non Reactive (10/11 0843)  HBsAg: Negative (08/02 1042)  HIV: Non Reactive (10/11 0843)  GC/Chlamydia: neg/neg GBS:   negative 2-hr GTT: 75-90-83 (third trimester) Genetic screening:  Low risk Anatomy US: resolved renal pyelectasis and choroid plexus cyst, normal anatomy  Prenatal Transfer Tool  Maternal Diabetes: No Genetic Screening: Normal Maternal Ultrasounds/Referrals: Normal Fetal Ultrasounds or other Referrals:  None Maternal Substance Abuse:  No Significant Maternal Medications:  None Significant Maternal Lab Results: None  No results found for this or any previous visit (from the past 24 hour(s)).  Patient Active Problem List   Diagnosis Date Noted  . Anemia in pregnancy 02/03/2018  . Supervision of low-risk pregnancy 11/25/2017  . Late prenatal care 11/25/2017    Assessment: Jaclyn Tyler is a 27 y.o. [id number] at 7045w0d here for SOL  #Labor: spontaneous labor #Pain: Prefers without pain medication, open to epidural #FWB: Cat I #ID: GBS negative #MOF: exclusive breast pumping #MOC: postpartum BTL #Circ: NA  Justine NullBridgid Wilson, MD Family Medicine PGY3 04/22/2018, 3:43 PM   OB FELLOW HISTORY AND PHYSICAL ATTESTATION  I have seen and examined this patient; I agree with above documentation in the resident's note.   Gwenevere AbbotNimeka Samyak Sackmann, MD  OB Fellow  04/22/2018, 6:41 PM

## 2018-04-22 NOTE — Anesthesia Procedure Notes (Signed)
Epidural Patient location during procedure: OB Start time: 04/22/2018 5:15 PM End time: 04/22/2018 5:30 PM  Staffing Anesthesiologist: Elmer PickerWoodrum, Shaakira Borrero L, MD Performed: anesthesiologist   Preanesthetic Checklist Completed: patient identified, pre-op evaluation, timeout performed, IV checked, risks and benefits discussed and monitors and equipment checked  Epidural Patient position: sitting Prep: site prepped and draped and DuraPrep Patient monitoring: continuous pulse ox, blood pressure, heart rate and cardiac monitor Approach: midline Location: L3-L4 Injection technique: LOR air  Needle:  Needle type: Tuohy  Needle gauge: 17 G Needle length: 9 cm Needle insertion depth: 8 cm Catheter type: closed end flexible Catheter size: 19 Gauge Catheter at skin depth: 15 cm Test dose: negative  Assessment Sensory level: T8 Events: blood not aspirated, injection not painful, no injection resistance, negative IV test and no paresthesia  Additional Notes Patient identified. Risks/Benefits/Options discussed with patient including but not limited to bleeding, infection, nerve damage, paralysis, failed block, incomplete pain control, headache, blood pressure changes, nausea, vomiting, reactions to medication both or allergic, itching and postpartum back pain. Confirmed with bedside nurse the patient's most recent platelet count. Confirmed with patient that they are not currently taking any anticoagulation, have any bleeding history or any family history of bleeding disorders. Patient expressed understanding and wished to proceed. All questions were answered. Sterile technique was used throughout the entire procedure. Please see nursing notes for vital signs. Test dose was given through epidural catheter and negative prior to continuing to dose epidural or start infusion. Warning signs of high block given to the patient including shortness of breath, tingling/numbness in hands, complete motor block,  or any concerning symptoms with instructions to call for help. Patient was given instructions on fall risk and not to get out of bed. All questions and concerns addressed with instructions to call with any issues or inadequate analgesia.  Reason for block:procedure for pain

## 2018-04-22 NOTE — Anesthesia Preprocedure Evaluation (Signed)
Anesthesia Evaluation  Patient identified by MRN, date of birth, ID band Patient awake    Reviewed: Allergy & Precautions, NPO status , Patient's Chart, lab work & pertinent test results  Airway Mallampati: II  TM Distance: >3 FB Neck ROM: Full    Dental no notable dental hx. (+) Teeth Intact   Pulmonary neg pulmonary ROS,    Pulmonary exam normal breath sounds clear to auscultation       Cardiovascular negative cardio ROS Normal cardiovascular exam Rhythm:Regular Rate:Normal     Neuro/Psych negative neurological ROS  negative psych ROS   GI/Hepatic Neg liver ROS, GERD  ,  Endo/Other  negative endocrine ROS  Renal/GU negative Renal ROS  negative genitourinary   Musculoskeletal negative musculoskeletal ROS (+)   Abdominal   Peds  Hematology  (+) Blood dyscrasia, anemia ,   Anesthesia Other Findings   Reproductive/Obstetrics (+) Pregnancy                             Anesthesia Physical Anesthesia Plan  ASA: II  Anesthesia Plan: Epidural   Post-op Pain Management:    Induction:   PONV Risk Score and Plan: Treatment may vary due to age or medical condition  Airway Management Planned: Natural Airway  Additional Equipment:   Intra-op Plan:   Post-operative Plan:   Informed Consent: I have reviewed the patients History and Physical, chart, labs and discussed the procedure including the risks, benefits and alternatives for the proposed anesthesia with the patient or authorized representative who has indicated his/her understanding and acceptance.     Plan Discussed with: Anesthesiologist  Anesthesia Plan Comments: (Patient identified. Risks, benefits, options discussed with patient including but not limited to bleeding, infection, nerve damage, paralysis, failed block, incomplete pain control, headache, blood pressure changes, nausea, vomiting, reactions to medication, itching,  and post partum back pain. Confirmed with bedside nurse the patient's most recent platelet count. Confirmed with the patient that they are not taking any anticoagulation, have any bleeding history or any family history of bleeding disorders. Patient expressed understanding and wishes to proceed. All questions were answered. )        Anesthesia Quick Evaluation

## 2018-04-22 NOTE — MAU Note (Signed)
Jaclyn Tyler is a 27 y.o. at 8019w0d here in MAU reporting:  +contractions. 5 minutes apart. +vaginal spotting +vaginal pressure Onset of complaint: membrane swept on Monday she reports with ongoing contractions all week that are now intense. [facility] Pain score: 8/10 Vitals:   04/22/18 1427  BP: 121/75  Pulse: 100  Resp: 18  Temp: 98.1 F (36.7 C)  SpO2: 99%    +FM

## 2018-04-23 ENCOUNTER — Inpatient Hospital Stay (HOSPITAL_COMMUNITY): Payer: Medicaid Other | Admitting: Anesthesiology

## 2018-04-23 ENCOUNTER — Encounter (HOSPITAL_COMMUNITY): Payer: Self-pay | Admitting: Obstetrics and Gynecology

## 2018-04-23 ENCOUNTER — Encounter (HOSPITAL_COMMUNITY)
Admission: AD | Disposition: A | Discharge status: Home / Self Care | Payer: Self-pay | Source: Home / Self Care | Attending: Family Medicine

## 2018-04-23 DIAGNOSIS — Z302 Encounter for sterilization: Secondary | ICD-10-CM

## 2018-04-23 HISTORY — PX: TUBAL LIGATION: SHX77

## 2018-04-23 LAB — RPR: RPR Ser Ql: NONREACTIVE

## 2018-04-23 SURGERY — LIGATION, FALLOPIAN TUBE, POSTPARTUM
Anesthesia: Epidural | Laterality: Bilateral

## 2018-04-23 MED ORDER — BUPIVACAINE HCL (PF) 0.5 % IJ SOLN
INTRAMUSCULAR | Status: AC
  Filled 2018-04-23: qty 30

## 2018-04-23 MED ORDER — FENTANYL CITRATE (PF) 100 MCG/2ML IJ SOLN
INTRAMUSCULAR | Status: DC | PRN
  Administered 2018-04-23: 100 ug via INTRAVENOUS

## 2018-04-23 MED ORDER — METOCLOPRAMIDE HCL 10 MG PO TABS
10.0000 mg | ORAL_TABLET | Freq: Once | ORAL | Status: AC
  Administered 2018-04-23: 10 mg via ORAL
  Filled 2018-04-23: qty 1

## 2018-04-23 MED ORDER — OXYCODONE-ACETAMINOPHEN 5-325 MG PO TABS
1.0000 | ORAL_TABLET | ORAL | Status: DC | PRN
  Administered 2018-04-23: 1 via ORAL
  Filled 2018-04-23: qty 1

## 2018-04-23 MED ORDER — SODIUM BICARBONATE 8.4 % IV SOLN
INTRAVENOUS | Status: DC | PRN
  Administered 2018-04-23 (×4): 5 mL via EPIDURAL

## 2018-04-23 MED ORDER — LIDOCAINE-EPINEPHRINE (PF) 2 %-1:200000 IJ SOLN
INTRAMUSCULAR | Status: AC
  Filled 2018-04-23: qty 20

## 2018-04-23 MED ORDER — FENTANYL CITRATE (PF) 100 MCG/2ML IJ SOLN
INTRAMUSCULAR | Status: AC
  Filled 2018-04-23: qty 2

## 2018-04-23 MED ORDER — LACTATED RINGERS IV SOLN: INTRAVENOUS | Status: DC

## 2018-04-23 MED ORDER — MIDAZOLAM HCL 2 MG/2ML IJ SOLN
INTRAMUSCULAR | Status: DC | PRN
  Administered 2018-04-23: 2 mg via INTRAVENOUS

## 2018-04-23 MED ORDER — FENTANYL CITRATE (PF) 100 MCG/2ML IJ SOLN: 25.0000 ug | INTRAMUSCULAR | Status: DC | PRN

## 2018-04-23 MED ORDER — SODIUM CHLORIDE 0.9 % IR SOLN
Status: DC | PRN
  Administered 2018-04-23: 1000 mL

## 2018-04-23 MED ORDER — LACTATED RINGERS IV SOLN
INTRAVENOUS | Status: DC
  Administered 2018-04-23 (×2): via INTRAVENOUS

## 2018-04-23 MED ORDER — MEPERIDINE HCL 25 MG/ML IJ SOLN: 6.2500 mg | INTRAMUSCULAR | Status: DC | PRN

## 2018-04-23 MED ORDER — MIDAZOLAM HCL 2 MG/2ML IJ SOLN
INTRAMUSCULAR | Status: AC
  Filled 2018-04-23: qty 2

## 2018-04-23 MED ORDER — FAMOTIDINE 20 MG PO TABS
40.0000 mg | ORAL_TABLET | Freq: Once | ORAL | Status: AC
  Administered 2018-04-23: 40 mg via ORAL
  Filled 2018-04-23: qty 2

## 2018-04-23 MED ORDER — BUPIVACAINE HCL (PF) 0.5 % IJ SOLN
INTRAMUSCULAR | Status: DC | PRN
  Administered 2018-04-23: 20 mL

## 2018-04-23 MED ORDER — ONDANSETRON HCL 4 MG/2ML IJ SOLN
INTRAMUSCULAR | Status: AC
  Filled 2018-04-23: qty 2

## 2018-04-23 MED ORDER — SODIUM BICARBONATE 8.4 % IV SOLN
INTRAVENOUS | Status: AC
  Filled 2018-04-23: qty 50

## 2018-04-23 MED ORDER — METOCLOPRAMIDE HCL 5 MG/ML IJ SOLN: 10.0000 mg | Freq: Once | INTRAMUSCULAR | Status: DC | PRN

## 2018-04-23 SURGICAL SUPPLY — 23 items
CHLORAPREP W/TINT 26ML (MISCELLANEOUS) ×3 IMPLANT
CLOTH BEACON ORANGE TIMEOUT ST (SAFETY) ×3 IMPLANT
DRSG OPSITE POSTOP 3X4 (GAUZE/BANDAGES/DRESSINGS) ×3 IMPLANT
GLOVE BIOGEL PI IND STRL 6.5 (GLOVE) ×1 IMPLANT
GLOVE BIOGEL PI IND STRL 7.0 (GLOVE) ×1 IMPLANT
GLOVE BIOGEL PI INDICATOR 6.5 (GLOVE) ×2
GLOVE BIOGEL PI INDICATOR 7.0 (GLOVE) ×2
GLOVE ORTHOPEDIC STR SZ6.5 (GLOVE) ×6 IMPLANT
GOWN STRL REUS W/TWL LRG LVL3 (GOWN DISPOSABLE) ×6 IMPLANT
HEMOSTAT ARISTA ABSORB 3G PWDR (MISCELLANEOUS) ×3 IMPLANT
NEEDLE HYPO 22GX1.5 SAFETY (NEEDLE) ×3 IMPLANT
NS IRRIG 1000ML POUR BTL (IV SOLUTION) ×3 IMPLANT
PACK ABDOMINAL MINOR (CUSTOM PROCEDURE TRAY) ×3 IMPLANT
SPONGE LAP 4X18 RFD (DISPOSABLE) ×3 IMPLANT
SUT MON AB 4-0 PS1 27 (SUTURE) ×3 IMPLANT
SUT PLAIN 0 NONE (SUTURE) ×6 IMPLANT
SUT PLAIN 2 0 (SUTURE) ×2
SUT PLAIN ABS 2-0 CT1 27XMFL (SUTURE) ×1 IMPLANT
SUT VICRYL 0 UR6 27IN ABS (SUTURE) ×3 IMPLANT
SYR CONTROL 10ML LL (SYRINGE) ×3 IMPLANT
TOWEL OR 17X24 6PK STRL BLUE (TOWEL DISPOSABLE) ×6 IMPLANT
TRAY FOLEY CATH SILVER 14FR (SET/KITS/TRAYS/PACK) ×3 IMPLANT
WATER STERILE IRR 1000ML POUR (IV SOLUTION) ×3 IMPLANT

## 2018-04-23 NOTE — Transfer of Care (Signed)
Immediate Anesthesia Transfer of Care Note  Patient: Jaclyn Tyler  Procedure(s) Performed: POST PARTUM TUBAL LIGATION (Bilateral )  Patient Location: PACU  Anesthesia Type:Epidural  Level of Consciousness: sedated  Airway & Oxygen Therapy: Patient Spontanous Breathing  Post-op Assessment: Report given to RN  Post vital signs: Reviewed and stable  Last Vitals:  Vitals Value Taken Time  BP    Temp    Pulse    Resp 13 04/23/2018 10:45 AM  SpO2    Vitals shown include unvalidated device data.  Last Pain:  Vitals:   04/23/18 0810  TempSrc:   PainSc: 7       Patients Stated Pain Goal: 0 (04/22/18 1431)  Complications: No apparent anesthesia complications

## 2018-04-23 NOTE — Anesthesia Preprocedure Evaluation (Signed)
Anesthesia Evaluation  Patient identified by MRN, date of birth, ID band Patient awake    Reviewed: Allergy & Precautions, NPO status , Patient's Chart, lab work & pertinent test results  Airway Mallampati: II  TM Distance: >3 FB Neck ROM: Full    Dental no notable dental hx. (+) Teeth Intact   Pulmonary neg pulmonary ROS,    Pulmonary exam normal breath sounds clear to auscultation       Cardiovascular negative cardio ROS Normal cardiovascular exam Rhythm:Regular Rate:Normal     Neuro/Psych negative neurological ROS  negative psych ROS   GI/Hepatic Neg liver ROS, GERD  ,  Endo/Other  negative endocrine ROS  Renal/GU negative Renal ROS  negative genitourinary   Musculoskeletal negative musculoskeletal ROS (+)   Abdominal   Peds  Hematology  (+) Blood dyscrasia, anemia ,   Anesthesia Other Findings   Reproductive/Obstetrics (+) Pregnancy                             Anesthesia Physical  Anesthesia Plan  ASA: II  Anesthesia Plan: Epidural   Post-op Pain Management:    Induction:   PONV Risk Score and Plan: Treatment may vary due to age or medical condition  Airway Management Planned: Natural Airway  Additional Equipment:   Intra-op Plan:   Post-operative Plan:   Informed Consent: I have reviewed the patients History and Physical, chart, labs and discussed the procedure including the risks, benefits and alternatives for the proposed anesthesia with the patient or authorized representative who has indicated his/her understanding and acceptance.     Plan Discussed with: Anesthesiologist  Anesthesia Plan Comments: (Existing labor epidural will be used)        Anesthesia Quick Evaluation

## 2018-04-23 NOTE — Anesthesia Postprocedure Evaluation (Signed)
Anesthesia Post Note  Patient: Jaclyn Tyler  Procedure(s) Performed: AN AD HOC LABOR EPIDURAL     Patient location during evaluation: Mother Baby Anesthesia Type: Epidural Level of consciousness: awake Pain management: satisfactory to patient Vital Signs Assessment: post-procedure vital signs reviewed and stable Respiratory status: spontaneous breathing Cardiovascular status: stable Anesthetic complications: no    Last Vitals:  Vitals:   04/23/18 0121 04/23/18 0654  BP: 117/78 111/71  Pulse: 62 68  Resp: (!) 22 18  Temp: 36.7 C 37 C  SpO2: 98% 100%    Last Pain:  Vitals:   04/23/18 0654  TempSrc: Oral  PainSc:    Pain Goal: Patients Stated Pain Goal: 0 (04/22/18 1431)               Cephus ShellingBURGER,Coraima Tibbs

## 2018-04-23 NOTE — Progress Notes (Signed)
Faculty Note  Patient is a 27 y.o. [id number] who is PPD#1 s/p SVD.  In to see patient, she again affirms desire to have bilateral tubal ligation done. She understands that this is a permanent procedure and she will not be able to have children after it is done. Reviewed risks of bilateral tubal ligation including infection, hemorrhage, damage to surrounding tissue and organs, risk of regret. Reviewed that bilateral tubal ligation is not 100% effective and she should take a pregnancy test if she believes for any reason she may be pregnant. Reviewed slightly increased risk of ectopic pregnancy and need to seek care if she becomes pregnant. She understands this is an elective procedure and again affirms her desire. Consent signed, also consents to blood transfusion if necessary.   She is NPO To OR when ready   K. Therese SarahMeryl Davis, M.D. Attending Obstetrician & Gynecologist, [facility] Center for [employer], [facility]

## 2018-04-23 NOTE — Progress Notes (Signed)
Post Partum Day #1 Subjective: no complaints, up ad lib and tolerating PO; has been NPO since midnight; still desires ppBTL; wants to pump/breastfeed but infant hasn't latched well yet  Objective: Blood pressure 111/71, pulse 68, temperature 98.6 F (37 C), temperature source Oral, resp. rate 18, weight 114.8 kg, last menstrual period 07/27/2017, SpO2 100 %, unknown if currently breastfeeding.  Physical Exam:  General: alert, cooperative and no distress Lochia: appropriate Uterine Fundus: firm DVT Evaluation: No evidence of DVT seen on physical exam.  Recent Labs    04/22/18 1607  HGB 10.9*  HCT 35.6*    Assessment/Plan: Plan for discharge tomorrow and Lactation consult  Ready for ppBTL today; plan for d/c tomorrow 12/30   LOS: 1 day   Arabella MerlesKimberly D Shaw CNM 04/23/2018, 7:02 AM

## 2018-04-23 NOTE — Progress Notes (Signed)
Moderate amt of bleeding noted on drsg and oozing out from bottom of drsg   Md notified   Small opening noted at incision w/blood oozing   Steris applied and new honeycomb  Md in to see incision

## 2018-04-23 NOTE — Lactation Note (Signed)
This note was copied from a baby's chart. Lactation Consultation Note  Patient Name: Jaclyn Tyler ZOXWR'UToday's Date: 04/23/2018  RN informed me that mom declines lactation assist.   Maternal Data    Feeding Feeding Type: Bottle Fed - Formula  LATCH Score                   Interventions    Lactation Tools Discussed/Used     Consult Status      Huston FoleyMOULDEN, Yelena Metzer S 04/23/2018, 10:28 AM

## 2018-04-23 NOTE — Op Note (Signed)
   Jaclyn Tyler 04/23/2018  PREOPERATIVE DIAGNOSES: Multiparity, undesired fertility  POSTOPERATIVE DIAGNOSES: Multiparity, undesired fertility  PROCEDURE:  Postpartum Bilateral Tubal Sterilization via Modified Pomeroy fashion   SURGEON: Baldemar LenisK. Meryl Davis, MD  ANESTHESIA:  Epidural and local analgesia using 20 ml of 0.5% Marcaine  COMPLICATIONS:  None immediate.  ESTIMATED BLOOD LOSS: 25 ml.  FLUIDS: 1000 ml LR.  URINE OUTPUT:  175 ml of clear urine.  INDICATIONS:  27 y.o. [id number] with undesired fertility,status post vaginal delivery, desires permanent sterilization.  Other reversible forms of contraception were discussed with patient; she declines all other modalities. Risks of procedure discussed with patient including but not limited to: risk of regret, permanence of method, bleeding, infection, injury to surrounding organs and need for additional procedures.  Failure risk of 1 -2 % with increased risk of ectopic gestation if pregnancy occurs was also discussed with patient.      FINDINGS:  Normal uterus, tubes, and ovaries. Small cystic structures with appearance of endometriosis on right tube.  PROCEDURE DETAILS: The patient was taken to the operating room where her epidural anesthesia was dosed up to surgical level and found to be adequate.  She was then placed in the dorsal supine position and prepped and draped in sterile fashion.  After an adequate timeout was performed, attention was turned to the patient's abdomen where a 4 cm transverse skin incision was made inferior to the umbilicus. The incision was taken down to the layer of fascia using the scalpel, and fascia was incised, and extended bilaterally using Mayo scissors. The peritoneum was entered sharply and the uterus and tubes were visualized.     Attention was then turned to the patient's uterus, and the right fallopian tube was clamped using a babcock and followed out to its fimbriated end. A 3 cm portion of the  mid-portion of the fallopian tube was grasped with babcock graspers and elevated. The elevated loop of tube was tied off with 2-0 plain gut. A second tie was placed under the first tie and a 2 cm loop of tube was excised by bluntly creating a hole in the mesosalpinx under the tied off portion above the suture and excising each end of tube with the metzenbaum scissors via a modified pomeroy fashion.  The remaining pedicles of the tube were inspected and found to be hemostatic. Under the pedicle, a small amount of bleeding noted and a third tie placed inferior to the first two with 2-0 plain gut and pedicle then noted to be hemostatic. Arista placed on area for additional hemostasis. The left fallopian tube was visualized and followed out to the fimbriated end.  A similar process was carried out for the left fallopian tube and hemostasis noted at the pedicles. The right tube was then visualized again and hemostasis noted at this pedicle. Good hemostasis was noted overall. The instruments were then removed from the patient's abdomen and the fascial incision was repaired with 0 Vicryl, the subcutaneous tissue was closed with 0 vicryl and the skin was closed with a 4-0 Monocryl subcuticular stitch. Marcaine 0.5% was injected around the incision. The patient tolerated the procedure well.  Instrument, sponge, and needle counts were correct times two.  The patient was then taken to the recovery room awake and in stable condition.   Baldemar LenisK. Meryl Davis, M.D. Attending Center for [employer] Midwife(Faculty Practice)

## 2018-04-24 ENCOUNTER — Encounter (HOSPITAL_COMMUNITY): Payer: Self-pay

## 2018-04-24 ENCOUNTER — Other Ambulatory Visit: Payer: Medicaid Other

## 2018-04-24 ENCOUNTER — Encounter: Payer: Medicaid Other | Admitting: Advanced Practice Midwife

## 2018-04-24 MED ORDER — SIMETHICONE 80 MG PO CHEW: 80.0000 mg | CHEWABLE_TABLET | ORAL | 0 refills | Status: DC | PRN

## 2018-04-24 MED ORDER — IBUPROFEN 600 MG PO TABS: 600.0000 mg | ORAL_TABLET | Freq: Four times a day (QID) | ORAL | 0 refills | Status: DC

## 2018-04-24 MED ORDER — SENNOSIDES-DOCUSATE SODIUM 8.6-50 MG PO TABS: 2.0000 | ORAL_TABLET | ORAL | 0 refills | Status: DC

## 2018-04-24 MED ORDER — ACETAMINOPHEN 325 MG PO TABS: 650.0000 mg | ORAL_TABLET | ORAL | 0 refills | Status: DC | PRN

## 2018-04-24 MED ORDER — OXYCODONE-ACETAMINOPHEN 5-325 MG PO TABS: 1.0000 | ORAL_TABLET | ORAL | 0 refills | Status: DC | PRN

## 2018-04-24 NOTE — Progress Notes (Signed)
CSW met with MOB via bedside after receiving consult for patient being anxious and attempting to "touch people in the OR". MOB was pleasant and appropriate during conversation. MOB was bonding with infant in room and feeding. MOB voiced feeling ready to get home today. MOB was open about having an anxiety attack yesterday while in the OR. MOB states she has had anxiety in her past and occasional panic attacks but states the one yesterday was the worst she has experienced. MOB voiced not liking "things" around her face/ head and feeling more anxious when she feels trapped- relating this to her experience in the OR yesterday. MOB stated she felt comfortable with talking to her OBGYN in the event her anxiety worsens/ in the event she feels any type of depression.   CSW provided education regarding Baby Blues vs PMADs and provided MOB with resources for mental health follow up.  CSW encouraged MOB to evaluate her mental health throughout the postpartum period with the use of the New Mom Checklist developed by Postpartum Progress as well as the Edinburgh Postnatal Depression Scale and notify a medical professional if symptoms arise.      MOB voiced no other concerns. Please reconsult CSW with any other needs.   Erin Davenport, LCSW Clinical Social Worker  [facility]  [phone]  

## 2018-04-24 NOTE — Anesthesia Postprocedure Evaluation (Signed)
Anesthesia Post Note  Patient: Jaclyn Tyler  Procedure(s) Performed: POST PARTUM TUBAL LIGATION (Bilateral )     Patient location during evaluation: Mother Baby Anesthesia Type: Epidural Level of consciousness: awake Pain management: satisfactory to patient Vital Signs Assessment: post-procedure vital signs reviewed and stable Respiratory status: spontaneous breathing Cardiovascular status: stable Anesthetic complications: no    Last Vitals:  Vitals:   04/23/18 2100 04/24/18 0633  BP: 119/68 107/74  Pulse: 70 79  Resp: 18 19  Temp:  37.1 C  SpO2: 100% 100%    Last Pain:  Vitals:   04/24/18 0750  TempSrc:   PainSc: 0-No pain   Pain Goal: Patients Stated Pain Goal: 0 (04/22/18 1431)               Cephus ShellingBURGER,[name]

## 2018-04-25 ENCOUNTER — Telehealth: Payer: Self-pay | Admitting: Family Medicine

## 2018-04-25 NOTE — Telephone Encounter (Signed)
Called patient and informed of the scheduled appointment.

## 2018-04-29 ENCOUNTER — Inpatient Hospital Stay (HOSPITAL_COMMUNITY): Admission: RE | Admit: 2018-04-29 | Payer: Medicaid Other | Source: Ambulatory Visit

## 2018-05-17 ENCOUNTER — Ambulatory Visit: Payer: Medicaid Other | Admitting: Nurse Practitioner

## 2018-05-25 ENCOUNTER — Ambulatory Visit (INDEPENDENT_AMBULATORY_CARE_PROVIDER_SITE_OTHER): Payer: Medicaid Other | Admitting: Advanced Practice Midwife

## 2018-05-25 ENCOUNTER — Encounter: Payer: Self-pay | Admitting: Advanced Practice Midwife

## 2018-05-25 VITALS — BP 104/75 | HR 88 | Ht 72.0 in | Wt 230.3 lb

## 2018-05-25 DIAGNOSIS — Z1389 Encounter for screening for other disorder: Secondary | ICD-10-CM | POA: Diagnosis not present

## 2018-05-25 DIAGNOSIS — O924 Hypogalactia: Secondary | ICD-10-CM

## 2018-05-25 NOTE — Patient Instructions (Signed)
Breastfeeding and Low Milk Supply  It is normal to have some problems when you start to breastfeed your new baby. One problem is having a low amount of breast milk. If you have a low milk supply, this may cause your baby to not gain enough weight. Making sure your breasts are emptied during feedings can help prevent a low milk supply.  Follow these instructions at home:  When to breastfeed your baby  · Breastfeed when you feel like you need to reduce the fullness of your breasts or when your baby shows signs of hunger. This is called "breastfeeding on demand."  · Do not delay feedings. Feed your baby often.  General instructions    · Try to empty your breasts of milk at each feeding. This will cause them to make more milk.  · If your breast is not empty after a feeding, use a pump or squeeze with your hand (hand express) to get the rest of the milk out.  · Make sure your baby's mouth attaches to your nipple (latches) properly when breastfeeding.  · Make sure your baby is in the right position when breastfeeding. Try different positions to find one that helps your baby feed better.  · Do not give your baby extra formula unless your doctor or breastfeeding specialist (lactation consultant) tells you to do that.  Medicines  · Let your doctor know what over-the-counter or prescription medicines you are taking. Some medicines may affect how much milk you make.  · Talk to your doctor or breastfeeding specialist before you take any herbal supplements.  Contact a doctor if:  · Your baby does not gain weight.  · Your baby loses weight.  · You continue to have a low milk supply.  Summary  · If you have a low milk supply, this may cause your baby to not gain enough weight.  · Feed your baby often. Do not delay feedings.  · Try to empty your breasts of milk at each feeding. Use a pump or squeeze with your hand (hand express) to get remaining milk out after a feeding.  This information is not intended to replace advice given to  you by your health care provider. Make sure you discuss any questions you have with your health care provider.  Document Released: 11/17/2016 Document Revised: 11/17/2016 Document Reviewed: 11/17/2016  Elsevier Interactive Patient Education © 2019 [employer] Inc[employer]

## 2018-05-25 NOTE — Progress Notes (Signed)
Subjective:     Jaclyn Tyler is a 28 y.o. female who presents for a postpartum visit. She is 4 weeks postpartum following a spontaneous vaginal delivery. I have fully reviewed the prenatal and intrapartum course. The delivery was at 40 gestational weeks. Outcome: spontaneous vaginal delivery. Anesthesia: epidural. Postpartum course has been uncomplicated. Baby's course has been uncomplicated. Baby is feeding by breast--pumping only. Decreased supply. Bleeding no bleeding. Bowel function is normal. Bladder function is normal. Patient is sexually active. Contraception method is tubal ligation. Postpartum depression screening: negative.  The following portions of the patient's history were reviewed and updated as appropriate: allergies, current medications, past family history, past medical history, past social history, past surgical history and problem list.  Review of Systems Pertinent items are noted in HPI.    Last Pap 2019 Nml  Objective:    BP 104/75   Pulse 88   Ht 6' (1.829 m)   Wt 230 lb 4.8 oz (104.5 kg)   LMP 07/27/2017   Breastfeeding Yes   BMI 31.23 kg/m   General:  alert and no distress   Breasts:  declined  Lungs: clear to auscultation bilaterally  Heart:  regular rate and rhythm, S1, S2 normal, no murmur, click, rub or gallop  Abdomen: soft, non-tender; bowel sounds normal; no masses,  no organomegaly   Vulva:  not evaluated  Vagina: not evaluated  Rectal Exam: Not performed.        Assessment:     Nml postpartum exam. Pap smear not done at today's visit.   Plan:    1. Contraception: tubal ligation 2. Recommend LC. Will see the one at Peds. Discussed methods to increase supply 3. Follow up in: 1 year or as needed.   4. Pap due 2022  Katrinka Blazing, [place], [place] 05/26/2018 8:54 AM

## 2018-06-22 NOTE — Telephone Encounter (Signed)
Left message to call back (on 11/18/2017). 

## 2018-06-22 NOTE — Telephone Encounter (Signed)
Left message to call back (on 11/18/2017).

## 2019-03-21 ENCOUNTER — Ambulatory Visit (HOSPITAL_COMMUNITY)
Admission: EM | Admit: 2019-03-21 | Discharge: 2019-03-21 | Disposition: A | Discharge status: Home / Self Care | Payer: Medicaid Other | Attending: Emergency Medicine | Admitting: Emergency Medicine

## 2019-03-21 ENCOUNTER — Other Ambulatory Visit: Payer: Self-pay

## 2019-03-21 ENCOUNTER — Encounter (HOSPITAL_COMMUNITY): Payer: Self-pay

## 2019-03-21 ENCOUNTER — Other Ambulatory Visit: Payer: Self-pay | Admitting: Emergency Medicine

## 2019-03-21 DIAGNOSIS — N644 Mastodynia: Secondary | ICD-10-CM

## 2019-03-21 MED ORDER — IBUPROFEN 800 MG PO TABS
ORAL_TABLET | ORAL | Status: AC
  Filled 2019-03-21: qty 1

## 2019-03-21 MED ORDER — IBUPROFEN 800 MG PO TABS
800.0000 mg | ORAL_TABLET | Freq: Once | ORAL | Status: AC
  Administered 2019-03-21: 800 mg via ORAL

## 2019-03-21 MED ORDER — NAPROXEN 500 MG PO TABS: 500.0000 mg | ORAL_TABLET | Freq: Two times a day (BID) | ORAL | 0 refills | Status: DC

## 2019-03-21 NOTE — ED Provider Notes (Signed)
[facility]    CSN: [id number] Arrival date & time: 03/21/19  0865      History   Chief Complaint Chief Complaint  Patient presents with  . Breast Pain    HPI Jaclyn Tyler is a 28 y.o. female.   Jaclyn Tyler presents with complaints of left breast pain which started a few days ago and has been worsening. No known injury or trauma to the area. No palpable or visible abnormality. Pain is worse with movement of the breast although it is constant as well. It is a burning sensation. Wearing a supportive sports bra has helped some. No discharge from the breast. No fevers. Hasn't taken any medications for symptoms. Denies any previous similar. No rash. No family history of breast cancer. LMP was 11/1. She has had a tubal ligation. Has had a nipple piercing in the past which has been removed. History  Of anemia, gerd.     ROS per HPI, negative if not otherwise mentioned.      Past Medical History:  Diagnosis Date  . Anemia   . Constipation   . GERD (gastroesophageal reflux disease)   . Gonorrhea     Patient Active Problem List   Diagnosis Date Noted  . Anemia in pregnancy 02/03/2018    Past Surgical History:  Procedure Laterality Date  . TUBAL LIGATION Bilateral 04/23/2018  . TUBAL LIGATION Bilateral 04/23/2018   Procedure: POST PARTUM TUBAL LIGATION;  Surgeon: Conan Bowens, MD;  Location: [facility];  Service: Gynecology;  Laterality: Bilateral;  . WISDOM TOOTH EXTRACTION      OB History    Gravida  6   Para  4   Term  4   Preterm  0   AB  2   Living  4     SAB  2   TAB  0   Ectopic  0   Multiple  0   Live Births  4            Home Medications    Prior to Admission medications   Medication Sig Start Date End Date Taking? Authorizing Provider  acetaminophen (TYLENOL) 325 MG tablet Take 2 tablets (650 mg total) by mouth every 4 (four) hours as needed (for pain scale < 4). 04/24/18   Henderson Newcomer, MD   ferrous gluconate (FERGON) 324 MG tablet Take 1 tablet (324 mg total) by mouth 2 (two) times daily with a meal. Patient not taking: Reported on 03/28/2018 02/04/18   Armando Reichert, CNM  ibuprofen (ADVIL,MOTRIN) 600 MG tablet Take 1 tablet (600 mg total) by mouth every 6 (six) hours. 04/24/18   Henderson Newcomer, MD  naproxen (NAPROSYN) 500 MG tablet Take 1 tablet (500 mg total) by mouth 2 (two) times daily. 03/21/19   Georgetta Haber, NP  oxyCODONE-acetaminophen (PERCOCET/ROXICET) 5-325 MG tablet Take 1-2 tablets by mouth every 4 (four) hours as needed for moderate pain or severe pain. Patient not taking: Reported on 05/25/2018 04/24/18   Henderson Newcomer, MD  Prenatal Multivit-Min-Fe-FA (PRENATAL VITAMINS) 0.8 MG tablet Take 1 tablet by mouth daily. 11/25/17   Constant, Peggy, MD  senna-docusate (SENOKOT-S) 8.6-50 MG tablet Take 2 tablets by mouth daily. Patient not taking: Reported on 05/25/2018 04/24/18   Henderson Newcomer, MD  simethicone (MYLICON) 80 MG chewable tablet Chew 1 tablet (80 mg total) by mouth as needed for flatulence. Patient not taking: Reported on 05/25/2018 04/24/18   Henderson Newcomer, MD  Family History Family History  Problem Relation Age of Onset  . Cancer Mother   . HIV/AIDS Mother   . HIV/AIDS Father   . Kidney failure Father     Social History Social History   Tobacco Use  . Smoking status: Never Smoker  . Smokeless tobacco: Never Used  Substance Use Topics  . Alcohol use: Yes    Comment: occ  . Drug use: Not Currently     Allergies   Mushroom extract complex and Other   Review of Systems Review of Systems   Physical Exam Triage Vital Signs ED Triage Vitals  Enc Vitals Group     BP 03/21/19 0837 106/86     Pulse --      Resp 03/21/19 0837 15     Temp 03/21/19 0837 98.8 F (37.1 C)     Temp Source 03/21/19 0837 Oral     SpO2 03/21/19 0837 100 %     Weight --      Height --      Head Circumference --      Peak Flow --      Pain  Score 03/21/19 0835 9     Pain Loc --      Pain Edu? --      Excl. in GC? --    No data found.  Updated Vital Signs BP 106/86 (BP Location: Right Arm)   Temp 98.8 F (37.1 C) (Oral)   Resp 15   SpO2 100%    Physical Exam Constitutional:      General: She is not in acute distress.    Appearance: She is well-developed.  Cardiovascular:     Rate and Rhythm: Normal rate.  Pulmonary:     Effort: Pulmonary effort is normal.  Chest:     Breasts:        Left: Tenderness present. No swelling, bleeding, mass, nipple discharge or skin change.       Comments: Left lateral breast as well as areolar tenderness on palpation; no palpable masses or lumps; skin WNL; patient tearful with exam and very tender; no redness or warmth; no rash Skin:    General: Skin is warm and dry.  Neurological:     Mental Status: She is alert and oriented to person, place, and time.      UC Treatments / Results  Labs (all labs ordered are listed, but only abnormal results are displayed) Labs Reviewed - No data to display  EKG   Radiology No results found.  Procedures Procedures (including critical care time)  Medications Ordered in UC Medications  ibuprofen (ADVIL) tablet 800 mg (has no administration in time range)    Initial Impression / Assessment and Plan / UC Course  I have reviewed the triage vital signs and the nursing notes.  Pertinent labs & imaging results that were available during my care of the patient were reviewed by me and considered in my medical decision making (see chart for details).     Significantly tender left breast with an otherwise normal physical exam. Non toxic. Further evaluation with the breast center recommended with referral for imaging completed. Pain management discussed in the meantime. Return precautions provided. Patient verbalized understanding and agreeable to plan.   Final Clinical Impressions(s) / UC Diagnoses   Final diagnoses:  Breast pain,  left     Discharge Instructions     Your breast exam is normal on palpation at this time today I do think further imaging is warranted, therefore  I have put in a referral to the breast center to set this up.  They will call you to schedule appointment.  Ice pack application, supportive bra or even a wrap to chest may be helpful with pain. Naproxen twice a day, take with food.  If you develop any redness, warmth, palpable or visible abnormalities don't hesitate to return.    ED Prescriptions    Medication Sig Dispense Auth. Provider   naproxen (NAPROSYN) 500 MG tablet Take 1 tablet (500 mg total) by mouth 2 (two) times daily. 30 tablet Zigmund Gottron, NP     PDMP not reviewed this encounter.   Zigmund Gottron, NP 03/21/19 [phone]

## 2019-03-21 NOTE — Discharge Instructions (Signed)
Your breast exam is normal on palpation at this time today I do think further imaging is warranted, therefore I have put in a referral to the breast center to set this up.  They will call you to schedule appointment.  Ice pack application, supportive bra or even a wrap to chest may be helpful with pain. Naproxen twice a day, take with food.  If you develop any redness, warmth, palpable or visible abnormalities don't hesitate to return.

## 2019-03-21 NOTE — ED Triage Notes (Signed)
Patient presents to Urgent Care with complaints of left breast pain since 5 days ago. Patient reports the pain has gotten progressively worse and feels like it is burning, no abnormalities noted on skin.

## 2019-03-28 ENCOUNTER — Other Ambulatory Visit: Payer: Self-pay

## 2019-03-28 ENCOUNTER — Other Ambulatory Visit: Payer: Self-pay | Admitting: Emergency Medicine

## 2019-03-28 ENCOUNTER — Ambulatory Visit
Admission: RE | Admit: 2019-03-28 | Discharge: 2019-03-28 | Disposition: A | Discharge status: Home / Self Care | Payer: Medicaid Other | Source: Ambulatory Visit | Attending: Emergency Medicine | Admitting: Emergency Medicine

## 2019-03-28 DIAGNOSIS — N644 Mastodynia: Secondary | ICD-10-CM

## 2019-03-28 DIAGNOSIS — N63 Unspecified lump in unspecified breast: Secondary | ICD-10-CM

## 2019-03-29 ENCOUNTER — Other Ambulatory Visit: Payer: Self-pay | Admitting: Emergency Medicine

## 2019-03-29 ENCOUNTER — Other Ambulatory Visit: Payer: Self-pay | Admitting: Obstetrics & Gynecology

## 2019-03-29 ENCOUNTER — Other Ambulatory Visit: Payer: Self-pay | Admitting: Advanced Practice Midwife

## 2019-03-29 ENCOUNTER — Other Ambulatory Visit: Payer: Self-pay | Admitting: Family Medicine

## 2019-03-29 ENCOUNTER — Telehealth (HOSPITAL_COMMUNITY): Payer: Self-pay | Admitting: Emergency Medicine

## 2019-03-29 DIAGNOSIS — N63 Unspecified lump in unspecified breast: Secondary | ICD-10-CM

## 2019-03-29 NOTE — Telephone Encounter (Signed)
Breast Center calling for signature for approval for breast biopsy.  It was explained that we are not her PCP and we cannot follow her for further breast issues.    It was explained to the caller at the Breast Center that the pt has Medicaid and she will need to call the PCP listed on her Medicaid card and get an emergency appointment with them so they can sign her biopsy request and follow up with the pt following procedure.  She stated she would call the pt and explain the situation.

## 2019-03-30 ENCOUNTER — Ambulatory Visit
Admission: RE | Admit: 2019-03-30 | Discharge: 2019-03-30 | Disposition: A | Discharge status: Home / Self Care | Payer: Medicaid Other | Source: Ambulatory Visit | Attending: Emergency Medicine | Admitting: Emergency Medicine

## 2019-03-30 ENCOUNTER — Other Ambulatory Visit: Payer: Self-pay

## 2019-03-30 ENCOUNTER — Ambulatory Visit
Admission: RE | Admit: 2019-03-30 | Discharge: 2019-03-30 | Disposition: A | Discharge status: Home / Self Care | Payer: Medicaid Other | Source: Ambulatory Visit | Attending: Obstetrics & Gynecology | Admitting: Obstetrics & Gynecology

## 2019-03-30 ENCOUNTER — Other Ambulatory Visit: Payer: Self-pay | Admitting: Obstetrics & Gynecology

## 2019-03-30 DIAGNOSIS — N63 Unspecified lump in unspecified breast: Secondary | ICD-10-CM

## 2019-04-02 ENCOUNTER — Other Ambulatory Visit: Payer: Self-pay | Admitting: Emergency Medicine

## 2019-04-02 DIAGNOSIS — N61 Mastitis without abscess: Secondary | ICD-10-CM

## 2019-04-03 ENCOUNTER — Other Ambulatory Visit: Payer: Self-pay | Admitting: Emergency Medicine

## 2019-04-12 ENCOUNTER — Other Ambulatory Visit: Payer: Self-pay | Admitting: Obstetrics & Gynecology

## 2019-04-12 DIAGNOSIS — N61 Mastitis without abscess: Secondary | ICD-10-CM

## 2019-04-17 ENCOUNTER — Other Ambulatory Visit: Payer: Self-pay | Admitting: Obstetrics & Gynecology

## 2019-04-17 ENCOUNTER — Ambulatory Visit
Admission: RE | Admit: 2019-04-17 | Discharge: 2019-04-17 | Disposition: A | Discharge status: Home / Self Care | Payer: Medicaid Other | Source: Ambulatory Visit | Attending: Emergency Medicine | Admitting: Emergency Medicine

## 2019-04-17 ENCOUNTER — Other Ambulatory Visit: Payer: Self-pay

## 2019-04-17 DIAGNOSIS — N61 Mastitis without abscess: Secondary | ICD-10-CM

## 2019-04-23 IMAGING — US US MFM OB DETAIL+14 WK
1 series · 13 of 28 positions shown · non-contrast
Comparison: none

[Series 1: us mfm ob detail+14 wk · 92 acquisitions, 13 frames shown]
[im 4/92]
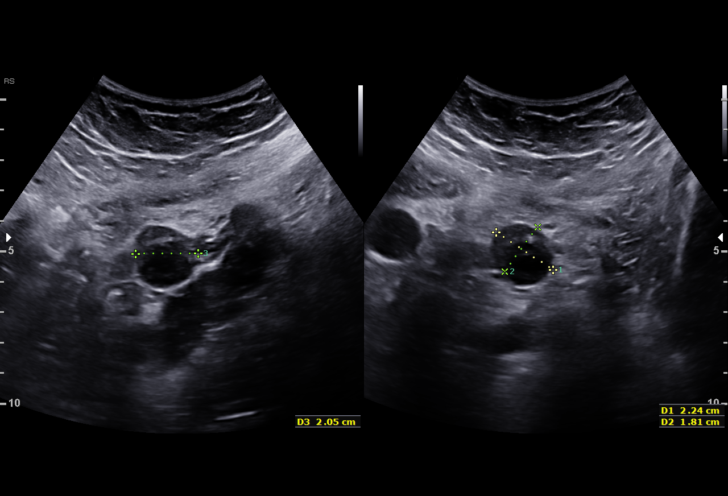
[im 11/92]
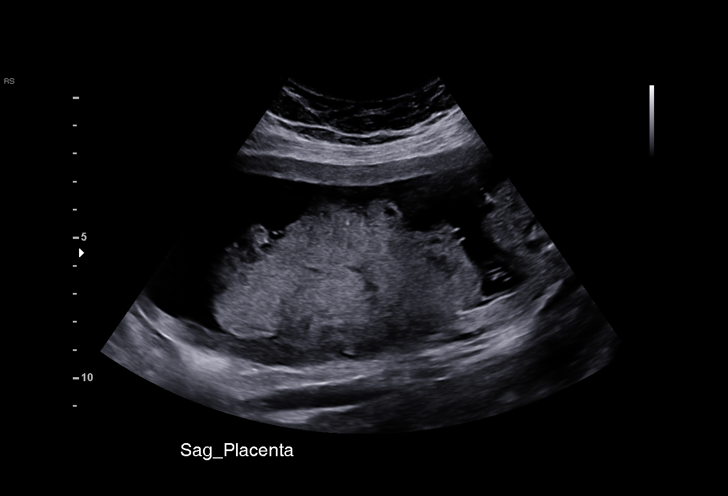
[im 17/92]
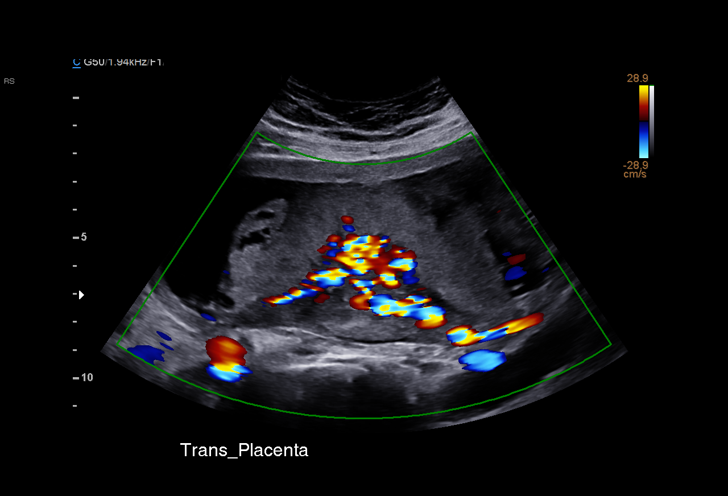
[im 24/92]
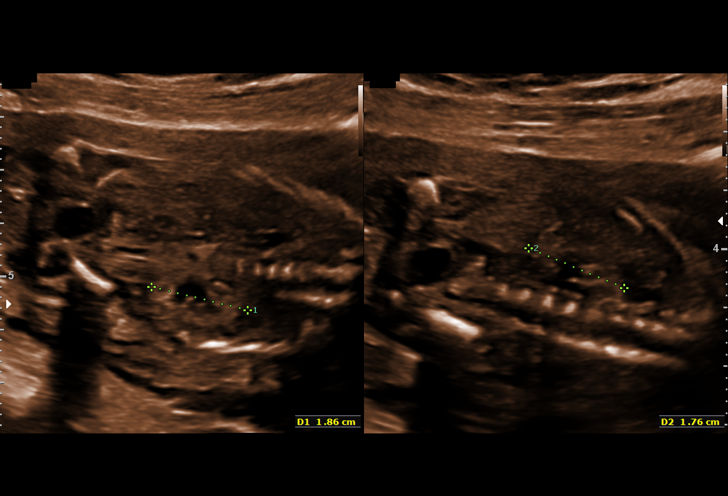
[im 31/92]
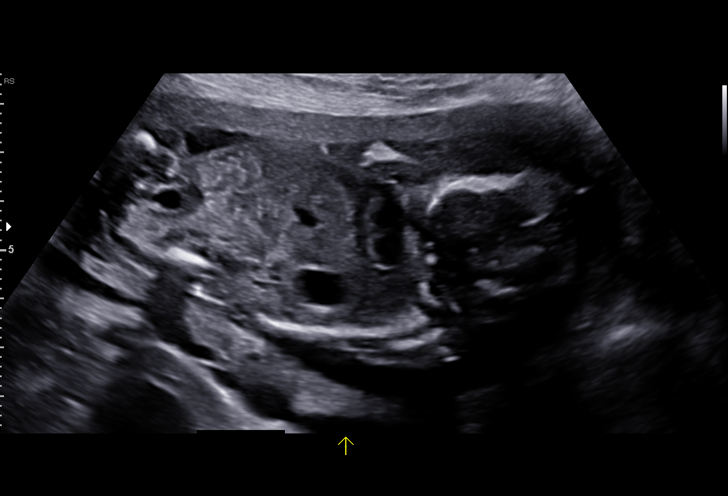
[im 38/92]
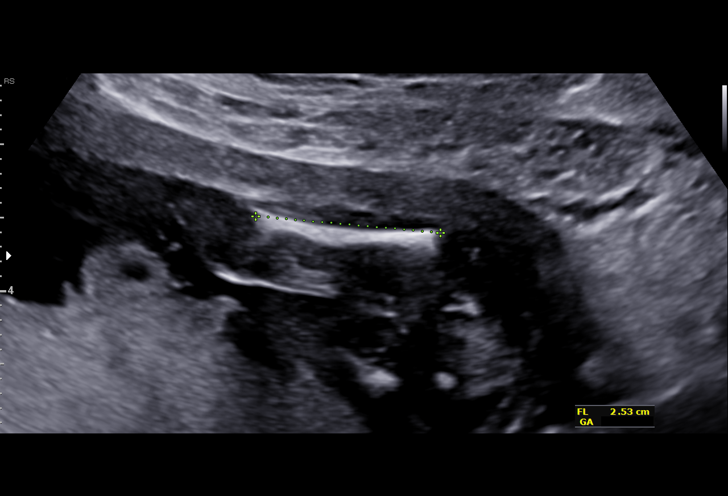
[im 48/92]
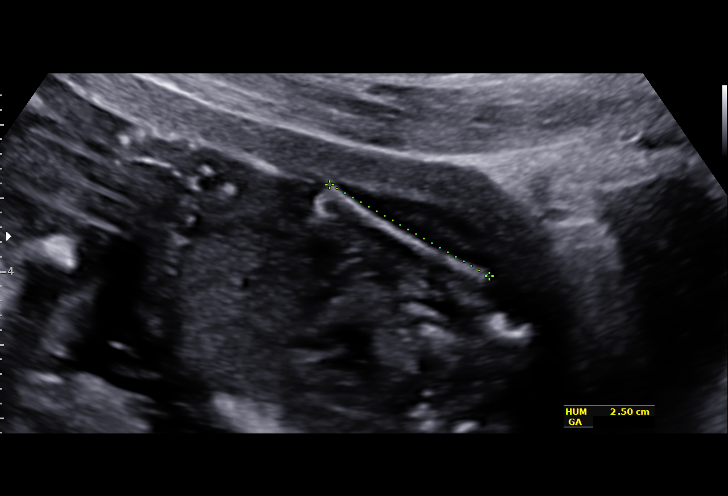
[im 54/92]
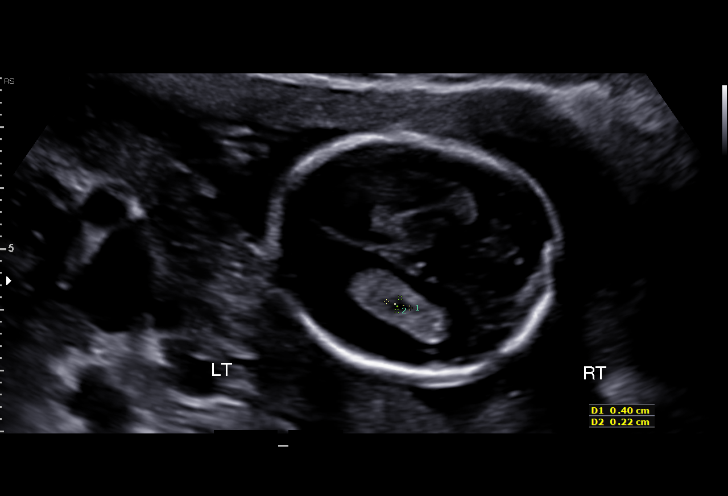
[im 61/92]
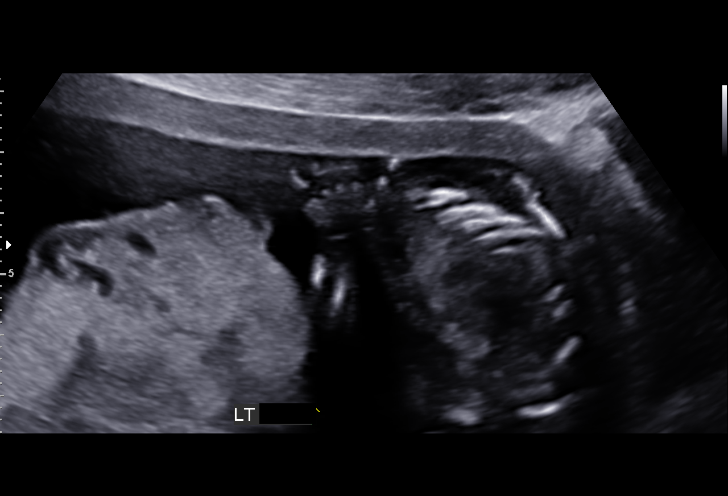
[im 68/92]
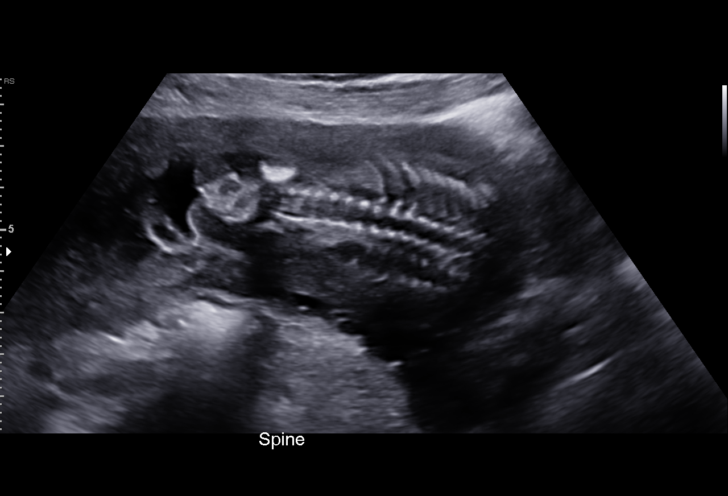
[im 75/92]
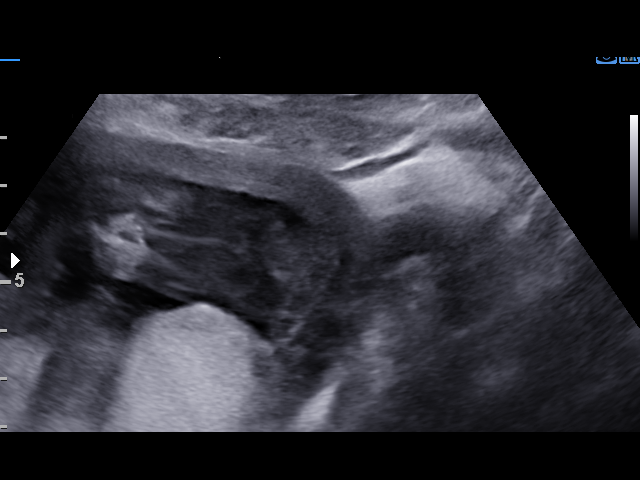
[im 81/92]
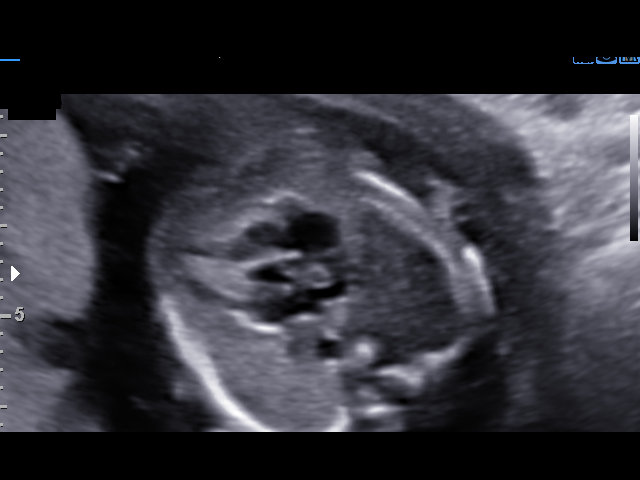
[im 88/92]
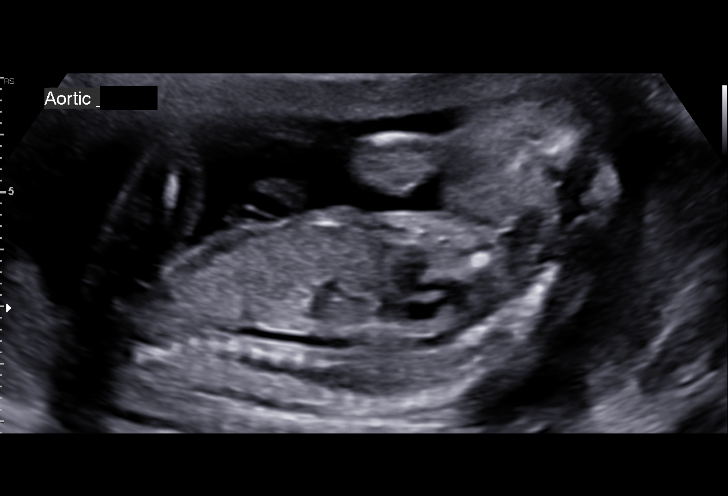

[13 of 28 positions shown; findings below may reference images not displayed]

OB/Gyn Clinic

1  MIEHLEKETO                 [id number]      [id number]     [id number]
JUNIOR
Indications

17 weeks gestation of pregnancy
Encounter for antenatal screening for
malformations
Encounter for uncertain dates
Obesity complicating pregnancy, second
trimester
Fetal abnormality - other known or
suspected (i.e. choriod plexus cyst, renal
pyelectasis)
OB History

Gravidity:    6         Term:   3         SAB:   2
Living:       3
Fetal Evaluation

Num Of Fetuses:     1
Fetal Heart         128
Rate(bpm):
Cardiac Activity:   Observed
Presentation:       Transverse, head to maternal right
Placenta:           Posterior
P. Cord Insertion:  Visualized, central
Amniotic Fluid
AFI FV:      Subjectively within normal limits

Largest Pocket(cm)
4.36
Biometry

BPD:      39.5  mm     G. Age:  18w 0d         57  %    CI:        72.63   %    70 - 86
FL/HC:      17.2   %    15.8 - 18
HC:      147.4  mm     G. Age:  17w 6d         43  %    HC/AC:      1.20        1.07 -
AC:      123.1  mm     G. Age:  17w 6d         51  %    FL/BPD:     64.3   %
FL:       25.4  mm     G. Age:  17w 5d         39  %    FL/AC:      20.6   %    20 - 24
HUM:      24.9  mm     G. Age:  17w 6d         54  %

Est. FW:     212  gm      0 lb 7 oz     50  %
Gestational Age

LMP:           2w 5d         Date:  10/30/17                 EDD:   08/06/18
U/S Today:     17w 6d                                        EDD:   04/22/18
Best:          17w 6d     Det. By:  U/S (11/18/17)           EDD:   04/22/18
Anatomy

Cranium:               Appears normal         Aortic Arch:            Not well visualized
Cavum:                 Not well visualized    Ductal Arch:            Not well visualized
Ventricles:            Not well visualized    Diaphragm:              Appears normal
Choroid Plexus:        Bilateral choroid      Stomach:                Appears normal, left
plexus cysts
sided
Cerebellum:            Not well visualized    Abdomen:                Appears normal
Posterior Fossa:       Not well visualized    Abdominal Wall:         Appears nml (cord
insert, abd wall)
Nuchal Fold:           Not well visualized    Cord Vessels:           Appears normal (3
vessel cord)
Face:                  Orbits nl; profile not Kidneys:                Bilat pyelectasis
well visualized
Lips:                  Not well visualized    Bladder:                Appears normal
Thoracic:              Appears normal         Spine:                  Not well visualized
Heart:                 Not well visualized    Upper Extremities:      Appears normal
RVOT:                  Not well visualized    Lower Extremities:      Appears normal
LVOT:                  Not well visualized

Other:  Parents do not wish to know sex of fetus. Heels and left 5th digit
visualized. Open hands visualized. Technically difficult due to
maternal habitus and fetal position.
Cervix Uterus Adnexa

Cervix
Length:           3.11  cm.
Normal appearance by transabdominal scan.

Uterus
No abnormality visualized.

Left Ovary
Within normal limits.
Right Ovary
Within normal limits.

Adnexa:       No abnormality visualized. No adnexal mass
visualized.
Impression

Patient is here for fetal anatomy scan.  She has not had
screening for fetal aneuploidies.  We performed a fetal
anatomy scan.  Bilateral choroid plexus cysts are seen.  In
addition bilateral pyelectases were seen.  The right renal
pelvis measures 4 mm in the left 6 mm.  Both kidneys look
normal with no increased echogenicities.

No other markers of aneuploidies are seen.  Fetal biometry is
consistent with 17 weeks 6 days gestation.

We have assigned the EDD at 04/22/2018 which is based on
today's ultrasound.

I reviewed the images after the patient had left.  We called
her cell phone which was answered by her sister.  We left a
message for the patient to contact us for follow-up
appointment and counseling.
Recommendations

I recommend cell free fetal DNA screening.
Follow-up appointment to be made for 4 weeks.

## 2019-10-30 ENCOUNTER — Other Ambulatory Visit: Payer: Medicaid Other

## 2019-11-21 ENCOUNTER — Emergency Department (HOSPITAL_COMMUNITY): Payer: Medicaid Other

## 2019-11-21 ENCOUNTER — Emergency Department (HOSPITAL_COMMUNITY)
Admission: EM | Admit: 2019-11-21 | Discharge: 2019-11-22 | Disposition: A | Discharge status: Home / Self Care | Payer: Medicaid Other | Attending: Emergency Medicine | Admitting: Emergency Medicine

## 2019-11-21 ENCOUNTER — Encounter (HOSPITAL_COMMUNITY): Payer: Self-pay | Admitting: Emergency Medicine

## 2019-11-21 DIAGNOSIS — W19XXXA Unspecified fall, initial encounter: Secondary | ICD-10-CM | POA: Insufficient documentation

## 2019-11-21 DIAGNOSIS — Z5321 Procedure and treatment not carried out due to patient leaving prior to being seen by health care provider: Secondary | ICD-10-CM | POA: Diagnosis not present

## 2019-11-21 DIAGNOSIS — M25552 Pain in left hip: Secondary | ICD-10-CM | POA: Diagnosis not present

## 2019-11-21 DIAGNOSIS — M533 Sacrococcygeal disorders, not elsewhere classified: Secondary | ICD-10-CM | POA: Insufficient documentation

## 2019-11-21 NOTE — ED Triage Notes (Signed)
Pt reports she was on a 59ft ladder at the top and fell backwards onto grass on her buttocks. Pt denies hitting head no LOC. Pt has pain to tailbone & left hip.

## 2019-11-22 NOTE — ED Notes (Signed)
Patient called twice for vitals and by registration with no answer and not visible in lobby

## 2019-12-19 ENCOUNTER — Ambulatory Visit
Admission: RE | Admit: 2019-12-19 | Discharge: 2019-12-19 | Disposition: A | Discharge status: Home / Self Care | Payer: Medicaid Other | Source: Ambulatory Visit | Attending: Obstetrics & Gynecology | Admitting: Obstetrics & Gynecology

## 2019-12-19 ENCOUNTER — Other Ambulatory Visit: Payer: Self-pay | Admitting: Obstetrics & Gynecology

## 2019-12-19 ENCOUNTER — Other Ambulatory Visit: Payer: Self-pay

## 2019-12-19 DIAGNOSIS — N61 Mastitis without abscess: Secondary | ICD-10-CM

## 2019-12-24 ENCOUNTER — Ambulatory Visit
Admission: RE | Admit: 2019-12-24 | Discharge: 2019-12-24 | Disposition: A | Discharge status: Home / Self Care | Payer: Medicaid Other | Source: Ambulatory Visit | Attending: Obstetrics & Gynecology | Admitting: Obstetrics & Gynecology

## 2019-12-24 ENCOUNTER — Other Ambulatory Visit: Payer: Self-pay | Admitting: Obstetrics & Gynecology

## 2019-12-24 ENCOUNTER — Other Ambulatory Visit: Payer: Self-pay

## 2019-12-24 DIAGNOSIS — N61 Mastitis without abscess: Secondary | ICD-10-CM

## 2019-12-29 LAB — AEROBIC/ANAEROBIC CULTURE W GRAM STAIN (SURGICAL/DEEP WOUND): Culture: NO GROWTH

## 2020-02-29 ENCOUNTER — Ambulatory Visit: Payer: Self-pay | Admitting: Surgery

## 2020-02-29 NOTE — H&P (Signed)
Jaclyn Tyler Appointment: 02/29/2020 11:40 AM Location: [facility] Patient #: [id number] DOB: November 21, 1990 Single / Language: [place] / Race: Black or African American Female  History of Present Illness Jaclyn Fus A. [name] MD; 02/29/2020 12:56 PM) Patient words: Patient presents for follow-up of chronic left breast mastitis. The area resolved but recurred at about the 1 o'clock position left breast just adjacent to the nipple areolar complex. She has more soreness and swelling at the site. No redness, fever, or drainage today though. She did not tolerate doxycycline very well. She now is interested in excision of this area and the central ducts associated with it.  The patient is a 29 year old female.   Allergies Jaclyn Tyler, [place]; 02/29/2020 12:04 PM) No Known Drug Allergies [01/14/2020]: Allergies Reconciled    Vitals Jaclyn Tyler RMA; 02/29/2020 12:05 PM) 02/29/2020 12:05 PM Weight: 183.38 lb Height: 70in Body Surface Area: 2.01 m Body Mass Index: 26.31 kg/m  Temp.: 97.32F  Pulse: 80 (Regular)  P.OX: 88% (Room air) BP: 106/68(Sitting, Left Arm, Standard)        Physical Exam (Jaclyn Tyler A. Jaclyn Fillingim MD; 02/29/2020 12:57 PM)  General Mental Status-Alert. General Appearance-Consistent with stated age. Hydration-Well hydrated. Voice-Normal.  Breast Note: Area of mild fullness left breast at about 1:00 at the nipple complex. No redness or fluctuance today but tender to touch. Not hot to touch. Sinus tract noted from area toward nipple with mild cosmetic dimpling noted  Neurologic Neurologic evaluation reveals -alert and oriented x 3 with no impairment of recent or remote memory. Mental Status-Normal.    Assessment & Plan (Jaclyn Heinle A. 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 We'll place her on Bactrim DS 1 tablet by mouth twice a day  for now we'll go ahead and set her up for left breast central duct excision. Risks, benefits and long-term expectations discussed. Risk of bleeding, infection, cosmetic deformity, recurrence, pain, revisional surgery, discussed. Risk of lumpectomy include bleeding, infection, seroma, more surgery, use of seed/wire, wound care, cosmetic deformity and the need for other treatments, death , blood clots, death. Pt agrees to proceed.  Current Plans Started Bactrim DS 800-160 MG Oral Tablet, 1 (one) Tablet two times daily, #14, 02/29/2020, No Refill. You are being scheduled for surgery- Our schedulers will call you.  You should hear from our office's scheduling department within 5 working days about the location, date, and time of surgery. We try to make accommodations for patient's preferences in scheduling surgery, but sometimes the OR schedule or the surgeon's schedule prevents [place] from making those accommodations.  If you have not heard from our office [phone]) in 5 working days, call the office and ask for your surgeon's nurse.  If you have other questions about your diagnosis, plan, or surgery, call the office and ask for your surgeon's nurse.  Pt Education - [facility] Breast Biopsy HCI: discussed with patient and provided information.

## 2020-03-07 ENCOUNTER — Encounter (HOSPITAL_COMMUNITY): Payer: Self-pay | Admitting: Emergency Medicine

## 2020-03-07 ENCOUNTER — Ambulatory Visit (HOSPITAL_COMMUNITY)
Admission: EM | Admit: 2020-03-07 | Discharge: 2020-03-07 | Disposition: A | Discharge status: Home / Self Care | Payer: Medicaid Other | Attending: Family Medicine | Admitting: Family Medicine

## 2020-03-07 ENCOUNTER — Other Ambulatory Visit: Payer: Self-pay

## 2020-03-07 DIAGNOSIS — L509 Urticaria, unspecified: Secondary | ICD-10-CM

## 2020-03-07 DIAGNOSIS — T7840XA Allergy, unspecified, initial encounter: Secondary | ICD-10-CM

## 2020-03-07 MED ORDER — TRIAMCINOLONE ACETONIDE 0.5 % EX CREA: 1.0000 "application " | TOPICAL_CREAM | Freq: Three times a day (TID) | CUTANEOUS | 0 refills | Status: DC

## 2020-03-07 MED ORDER — DEXAMETHASONE SODIUM PHOSPHATE 10 MG/ML IJ SOLN
INTRAMUSCULAR | Status: AC
  Filled 2020-03-07: qty 1

## 2020-03-07 MED ORDER — DEXAMETHASONE SODIUM PHOSPHATE 10 MG/ML IJ SOLN
10.0000 mg | Freq: Once | INTRAMUSCULAR | Status: AC
  Administered 2020-03-07: 10 mg via INTRAMUSCULAR

## 2020-03-07 MED ORDER — CETIRIZINE HCL 10 MG PO TABS: 10.0000 mg | ORAL_TABLET | Freq: Every evening | ORAL | 0 refills | Status: DC | PRN

## 2020-03-07 NOTE — ED Provider Notes (Signed)
[facility]    CSN: [id number] Arrival date & time: 03/07/20  6283      History   Chief Complaint Chief Complaint  Patient presents with  . Urticaria    HPI Jaclyn Tyler is a 29 y.o. female.   HPI   Patient presents today with you to Okey Regal generalized rash which developed immediately upon cessation of a course of doxycycline which she was taken for an unrelated issue.  Patient reports she had never previously taken doxycycline in the past therefore was unaware of if she has any sensitivities to medication.   Denies any difficulty swallowing or throat swelling, nausea, or vomiting.  Endorses generalized itching with the eruption of hives and dry patch.  She has been scratching and has not taken any OTC antihistamines for symptoms.  Past Medical History:  Diagnosis Date  . Anemia   . Constipation   . GERD (gastroesophageal reflux disease)   . Gonorrhea     There are no problems to display for this patient.   Past Surgical History:  Procedure Laterality Date  . TUBAL LIGATION Bilateral 04/23/2018  . TUBAL LIGATION Bilateral 04/23/2018   Procedure: POST PARTUM TUBAL LIGATION;  Surgeon: Conan Bowens, MD;  Location: [facility] BIRTHING SU[facility];  Service: Gynecology;  Laterality: Bilateral;  . WISDOM TOOTH EXTRACTION      OB History    Gravida  6   Para  4   Term  4   Preterm  0   AB  2   Living  4     SAB  2   TAB  0   Ectopic  0   Multiple  0   Live Births  4            Home Medications    Prior to Admission medications   Medication Sig Start Date End Date Taking? Authorizing Provider  acetaminophen (TYLENOL) 325 MG tablet Take 2 tablets (650 mg total) by mouth every 4 (four) hours as needed (for pain scale < 4). 04/24/18   Henderson Newcomer, MD  ferrous gluconate (FERGON) 324 MG tablet Take 1 tablet (324 mg total) by mouth 2 (two) times daily with a meal. Patient not taking: Reported on 03/28/2018 02/04/18   Armando Reichert, CNM    ibuprofen (ADVIL,MOTRIN) 600 MG tablet Take 1 tablet (600 mg total) by mouth every 6 (six) hours. 04/24/18   Henderson Newcomer, MD  naproxen (NAPROSYN) 500 MG tablet Take 1 tablet (500 mg total) by mouth 2 (two) times daily. 03/21/19   Georgetta Haber, NP  oxyCODONE-acetaminophen (PERCOCET/ROXICET) 5-325 MG tablet Take 1-2 tablets by mouth every 4 (four) hours as needed for moderate pain or severe pain. Patient not taking: Reported on 05/25/2018 04/24/18   Henderson Newcomer, MD  Prenatal Multivit-Min-Fe-FA (PRENATAL VITAMINS) 0.8 MG tablet Take 1 tablet by mouth daily. 11/25/17   Constant, Peggy, MD  senna-docusate (SENOKOT-S) 8.6-50 MG tablet Take 2 tablets by mouth daily. Patient not taking: Reported on 05/25/2018 04/24/18   Henderson Newcomer, MD  simethicone (MYLICON) 80 MG chewable tablet Chew 1 tablet (80 mg total) by mouth as needed for flatulence. Patient not taking: Reported on 05/25/2018 04/24/18   Henderson Newcomer, MD    Family History Family History  Problem Relation Age of Onset  . Cancer Mother   . HIV/AIDS Mother   . HIV/AIDS Father   . Kidney failure Father     Social History Social History   Tobacco Use  .  Smoking status: Never Smoker  . Smokeless tobacco: Never Used  Vaping Use  . Vaping Use: Never used  Substance Use Topics  . Alcohol use: Yes    Comment: occ  . Drug use: Not Currently     Allergies   Mushroom extract complex and Other   Review of Systems Review of Systems Pertinent negatives listed in HPI   Physical Exam Triage Vital Signs ED Triage Vitals  Enc Vitals Group     BP 03/07/20 1011 113/69     Pulse Rate 03/07/20 1011 69     Resp --      Temp 03/07/20 1011 98.6 F (37 C)     Temp Source 03/07/20 1011 Oral     SpO2 03/07/20 1011 97 %     Weight --      Height --      Head Circumference --      Peak Flow --      Pain Score 03/07/20 1008 0     Pain Loc --      Pain Edu? --      Excl. in GC? --    No data found.  Updated  Vital Signs BP 113/69 (BP Location: Right Arm)   Pulse 69   Temp 98.6 F (37 C) (Oral)   LMP 03/03/2020   SpO2 97%   Visual Acuity Right Eye Distance:   Left Eye Distance:   Bilateral Distance:    Right Eye Near:   Left Eye Near:    Bilateral Near:     Physical Exam General appearance: alert, well developed, well nourished, cooperative and in no distress Head: Normocephalic, without obvious abnormality, atraumatic Respiratory: Respirations even and unlabored, normal respiratory rate Heart: rate and rhythm normal. No gallop or murmurs noted on exam  Abdomen: BS +, no distention, no rebound tenderness, or no mass Extremities: No gross deformities Skin: Urticarial rash present  Psych: Appropriate mood and affect. Neurologic: Mental status: Alert, oriented to person, place, and time, thought content appropriate.   UC Treatments / Results  Labs (all labs ordered are listed, but only abnormal results are displayed) Labs Reviewed - No data to display  EKG   Radiology No results found.  Procedures Procedures (including critical care time)  Medications Ordered in UC Medications - No data to display  Initial Impression / Assessment and Plan / UC Course  I have reviewed the triage vital signs and the nursing notes.  Pertinent labs & imaging results that were available during my care of the patient were reviewed by me and considered in my medical decision making (see chart for details).   Patient presents today with a hive/uticaria like reaction rash eruption following cessation of doxycycline.  Treating with Decadron 10 mg IM here in clinic today.  Prescribed 3 times daily triamcinolone cream to be applied to affected area of skin as needed.  Also recommended starting cetirizine at bedtime until rash completely resolves.  Red flags discussed warranting immediate follow-up with PCP. Final Clinical Impressions(s) / UC Diagnoses   Final diagnoses:  Urticaria  Allergic  reaction, initial encounter   Discharge Instructions   None    ED Prescriptions    Medication Sig Dispense Auth. Provider   triamcinolone cream (KENALOG) 0.5 % Apply 1 application topically 3 (three) times daily. 454 g Bing Neighbors, FNP   cetirizine (ZYRTEC) 10 MG tablet Take 1 tablet (10 mg total) by mouth at bedtime as needed (Itching and hives). 30 tablet Bing Neighbors,  FNP     PDMP not reviewed this encounter.   Bing Neighbors, FNP 03/07/20 1050

## 2020-03-07 NOTE — ED Triage Notes (Signed)
Pt c/o possible hives on her leg and abd and breast onset yesterday. She states she has a spot on her left leg that looks like possible ringworm. She states she was taking an new antibiotic for a cyst in her breast about 2 weeks ago.

## 2020-04-03 ENCOUNTER — Encounter (HOSPITAL_BASED_OUTPATIENT_CLINIC_OR_DEPARTMENT_OTHER): Payer: Self-pay | Admitting: Surgery

## 2020-04-03 ENCOUNTER — Other Ambulatory Visit: Payer: Self-pay

## 2020-04-05 ENCOUNTER — Other Ambulatory Visit (HOSPITAL_COMMUNITY): Payer: Medicaid Other

## 2020-04-07 ENCOUNTER — Encounter (HOSPITAL_BASED_OUTPATIENT_CLINIC_OR_DEPARTMENT_OTHER)
Admission: RE | Admit: 2020-04-07 | Discharge: 2020-04-07 | Disposition: A | Discharge status: Home / Self Care | Payer: Medicaid Other | Source: Ambulatory Visit | Attending: Surgery | Admitting: Surgery

## 2020-04-07 ENCOUNTER — Other Ambulatory Visit (HOSPITAL_COMMUNITY): Payer: Medicaid Other

## 2020-04-07 DIAGNOSIS — Z01818 Encounter for other preprocedural examination: Secondary | ICD-10-CM | POA: Diagnosis present

## 2020-04-07 LAB — CBC WITH DIFFERENTIAL/PLATELET
Abs Immature Granulocytes: 0.03 10*3/uL (ref 0.00–0.07)
Basophils Absolute: 0.1 10*3/uL (ref 0.0–0.1)
Basophils Relative: 1 %
Eosinophils Absolute: 0.1 10*3/uL (ref 0.0–0.5)
Eosinophils Relative: 1 %
HCT: 35.2 % — ABNORMAL LOW (ref 36.0–46.0)
Hemoglobin: 11.1 g/dL — ABNORMAL LOW (ref 12.0–15.0)
Immature Granulocytes: 0 %
Lymphocytes Relative: 20 %
Lymphs Abs: 1.9 10*3/uL (ref 0.7–4.0)
MCH: 23.6 pg — ABNORMAL LOW (ref 26.0–34.0)
MCHC: 31.5 g/dL (ref 30.0–36.0)
MCV: 74.7 fL — ABNORMAL LOW (ref 80.0–100.0)
Monocytes Absolute: 0.6 10*3/uL (ref 0.1–1.0)
Monocytes Relative: 7 %
Neutro Abs: 6.5 10*3/uL (ref 1.7–7.7)
Neutrophils Relative %: 71 %
Platelets: 342 10*3/uL (ref 150–400)
RBC: 4.71 MIL/uL (ref 3.87–5.11)
RDW: 16.9 % — ABNORMAL HIGH (ref 11.5–15.5)
WBC: 9.2 10*3/uL (ref 4.0–10.5)
nRBC: 0 % (ref 0.0–0.2)

## 2020-04-07 LAB — COMPREHENSIVE METABOLIC PANEL
ALT: 12 U/L (ref 0–44)
AST: 16 U/L (ref 15–41)
Albumin: 3.3 g/dL — ABNORMAL LOW (ref 3.5–5.0)
Alkaline Phosphatase: 73 U/L (ref 38–126)
Anion gap: 8 (ref 5–15)
BUN: 11 mg/dL (ref 6–20)
CO2: 25 mmol/L (ref 22–32)
Calcium: 9 mg/dL (ref 8.9–10.3)
Chloride: 106 mmol/L (ref 98–111)
Creatinine, Ser: 0.78 mg/dL (ref 0.44–1.00)
GFR, Estimated: >60 mL/min (ref >60)
Glucose, Bld: 74 mg/dL (ref 70–99)
Potassium: 4.4 mmol/L (ref 3.5–5.1)
Sodium: 139 mmol/L (ref 135–145)
Total Bilirubin: 0.6 mg/dL (ref 0.3–1.2)
Total Protein: 7 g/dL (ref 6.5–8.1)

## 2020-04-07 NOTE — Progress Notes (Signed)

## 2020-04-08 ENCOUNTER — Other Ambulatory Visit (HOSPITAL_COMMUNITY)
Admission: RE | Admit: 2020-04-08 | Discharge: 2020-04-08 | Disposition: A | Discharge status: Home / Self Care | Payer: Medicaid Other | Source: Ambulatory Visit | Attending: Surgery | Admitting: Surgery

## 2020-04-08 DIAGNOSIS — Z01812 Encounter for preprocedural laboratory examination: Secondary | ICD-10-CM | POA: Diagnosis not present

## 2020-04-08 DIAGNOSIS — Z20822 Contact with and (suspected) exposure to covid-19: Secondary | ICD-10-CM | POA: Insufficient documentation

## 2020-04-08 LAB — SARS CORONAVIRUS 2 (TAT 6-24 HRS): SARS Coronavirus 2: NEGATIVE

## 2020-04-09 ENCOUNTER — Ambulatory Visit (HOSPITAL_BASED_OUTPATIENT_CLINIC_OR_DEPARTMENT_OTHER): Payer: Medicaid Other | Admitting: Anesthesiology

## 2020-04-09 ENCOUNTER — Other Ambulatory Visit: Payer: Self-pay

## 2020-04-09 ENCOUNTER — Encounter (HOSPITAL_BASED_OUTPATIENT_CLINIC_OR_DEPARTMENT_OTHER)
Admission: RE | Disposition: A | Discharge status: Home / Self Care | Payer: Self-pay | Source: Home / Self Care | Attending: Surgery

## 2020-04-09 ENCOUNTER — Encounter (HOSPITAL_BASED_OUTPATIENT_CLINIC_OR_DEPARTMENT_OTHER): Payer: Self-pay | Admitting: Surgery

## 2020-04-09 ENCOUNTER — Ambulatory Visit (HOSPITAL_BASED_OUTPATIENT_CLINIC_OR_DEPARTMENT_OTHER)
Admission: RE | Admit: 2020-04-09 | Discharge: 2020-04-09 | Disposition: A | Discharge status: Home / Self Care | Payer: Medicaid Other | Attending: Surgery | Admitting: Surgery

## 2020-04-09 DIAGNOSIS — D649 Anemia, unspecified: Secondary | ICD-10-CM | POA: Diagnosis not present

## 2020-04-09 DIAGNOSIS — N6489 Other specified disorders of breast: Secondary | ICD-10-CM | POA: Diagnosis present

## 2020-04-09 DIAGNOSIS — N6012 Diffuse cystic mastopathy of left breast: Secondary | ICD-10-CM | POA: Diagnosis not present

## 2020-04-09 DIAGNOSIS — N611 Abscess of the breast and nipple: Secondary | ICD-10-CM | POA: Insufficient documentation

## 2020-04-09 HISTORY — DX: Other complications of anesthesia, initial encounter: T88.59XA

## 2020-04-09 HISTORY — DX: Panic disorder (episodic paroxysmal anxiety): F41.0

## 2020-04-09 HISTORY — PX: BREAST CYST EXCISION: SHX579

## 2020-04-09 LAB — POCT PREGNANCY, URINE: Preg Test, Ur: NEGATIVE

## 2020-04-09 SURGERY — EXCISION, CYST, BREAST
Anesthesia: General | Site: Breast | Laterality: Left

## 2020-04-09 MED ORDER — AMISULPRIDE (ANTIEMETIC) 5 MG/2ML IV SOLN: 10.0000 mg | Freq: Once | INTRAVENOUS | Status: DC | PRN

## 2020-04-09 MED ORDER — CHLORHEXIDINE GLUCONATE CLOTH 2 % EX PADS: 6.0000 | MEDICATED_PAD | Freq: Once | CUTANEOUS | Status: DC

## 2020-04-09 MED ORDER — OXYCODONE HCL 5 MG PO TABS
ORAL_TABLET | ORAL | Status: AC
  Filled 2020-04-09: qty 1

## 2020-04-09 MED ORDER — LIDOCAINE HCL (CARDIAC) PF 100 MG/5ML IV SOSY
PREFILLED_SYRINGE | INTRAVENOUS | Status: DC | PRN
  Administered 2020-04-09: 50 mg via INTRAVENOUS

## 2020-04-09 MED ORDER — OXYCODONE HCL 5 MG/5ML PO SOLN: 5.0000 mg | Freq: Once | ORAL | Status: AC | PRN

## 2020-04-09 MED ORDER — DROPERIDOL 2.5 MG/ML IJ SOLN
INTRAMUSCULAR | Status: DC | PRN
  Administered 2020-04-09: .625 mg via INTRAVENOUS

## 2020-04-09 MED ORDER — PHENYLEPHRINE 40 MCG/ML (10ML) SYRINGE FOR IV PUSH (FOR BLOOD PRESSURE SUPPORT)
PREFILLED_SYRINGE | INTRAVENOUS | Status: AC
  Filled 2020-04-09: qty 10

## 2020-04-09 MED ORDER — MEPERIDINE HCL 25 MG/ML IJ SOLN: 6.2500 mg | INTRAMUSCULAR | Status: DC | PRN

## 2020-04-09 MED ORDER — PROMETHAZINE HCL 25 MG/ML IJ SOLN: 6.2500 mg | INTRAMUSCULAR | Status: DC | PRN

## 2020-04-09 MED ORDER — PROPOFOL 10 MG/ML IV BOLUS
INTRAVENOUS | Status: DC | PRN
  Administered 2020-04-09: 200 mg via INTRAVENOUS

## 2020-04-09 MED ORDER — DEXMEDETOMIDINE HCL IN NACL 200 MCG/50ML IV SOLN
INTRAVENOUS | Status: DC | PRN
  Administered 2020-04-09: 16 ug via INTRAVENOUS

## 2020-04-09 MED ORDER — DEXTROSE 5 % IV SOLN
3.0000 g | INTRAVENOUS | Status: AC
  Administered 2020-04-09: 14:00:00 2 g via INTRAVENOUS
  Filled 2020-04-09: qty 3000

## 2020-04-09 MED ORDER — IBUPROFEN 800 MG PO TABS: 800.0000 mg | ORAL_TABLET | Freq: Three times a day (TID) | ORAL | 0 refills | Status: DC | PRN

## 2020-04-09 MED ORDER — HYDROCODONE-ACETAMINOPHEN 5-325 MG PO TABS: 1.0000 | ORAL_TABLET | Freq: Four times a day (QID) | ORAL | 0 refills | Status: DC | PRN

## 2020-04-09 MED ORDER — FENTANYL CITRATE (PF) 100 MCG/2ML IJ SOLN
INTRAMUSCULAR | Status: DC | PRN
  Administered 2020-04-09: 100 ug via INTRAVENOUS

## 2020-04-09 MED ORDER — LIDOCAINE 2% (20 MG/ML) 5 ML SYRINGE
INTRAMUSCULAR | Status: AC
  Filled 2020-04-09: qty 5

## 2020-04-09 MED ORDER — BUPIVACAINE-EPINEPHRINE 0.25% -1:200000 IJ SOLN
INTRAMUSCULAR | Status: DC | PRN
  Administered 2020-04-09: 8 mL

## 2020-04-09 MED ORDER — EPHEDRINE SULFATE 50 MG/ML IJ SOLN
INTRAMUSCULAR | Status: DC | PRN
  Administered 2020-04-09: 10 mg via INTRAVENOUS

## 2020-04-09 MED ORDER — EPHEDRINE 5 MG/ML INJ
INTRAVENOUS | Status: AC
  Filled 2020-04-09: qty 10

## 2020-04-09 MED ORDER — FENTANYL CITRATE (PF) 100 MCG/2ML IJ SOLN: 25.0000 ug | INTRAMUSCULAR | Status: DC | PRN

## 2020-04-09 MED ORDER — ONDANSETRON HCL 4 MG/2ML IJ SOLN
INTRAMUSCULAR | Status: DC | PRN
  Administered 2020-04-09: 4 mg via INTRAVENOUS

## 2020-04-09 MED ORDER — LACTATED RINGERS IV SOLN: INTRAVENOUS | Status: DC

## 2020-04-09 MED ORDER — DEXAMETHASONE SODIUM PHOSPHATE 4 MG/ML IJ SOLN
INTRAMUSCULAR | Status: DC | PRN
  Administered 2020-04-09: 5 mg via INTRAVENOUS

## 2020-04-09 MED ORDER — MIDAZOLAM HCL 5 MG/5ML IJ SOLN
INTRAMUSCULAR | Status: DC | PRN
  Administered 2020-04-09: 2 mg via INTRAVENOUS

## 2020-04-09 MED ORDER — CLINDAMYCIN HCL 300 MG PO CAPS: 300.0000 mg | ORAL_CAPSULE | Freq: Three times a day (TID) | ORAL | 0 refills | Status: AC

## 2020-04-09 MED ORDER — PROPOFOL 10 MG/ML IV BOLUS
INTRAVENOUS | Status: AC
  Filled 2020-04-09: qty 20

## 2020-04-09 MED ORDER — ONDANSETRON HCL 4 MG/2ML IJ SOLN
INTRAMUSCULAR | Status: AC
  Filled 2020-04-09: qty 2

## 2020-04-09 MED ORDER — MIDAZOLAM HCL 2 MG/2ML IJ SOLN
INTRAMUSCULAR | Status: AC
  Filled 2020-04-09: qty 2

## 2020-04-09 MED ORDER — DEXAMETHASONE SODIUM PHOSPHATE 10 MG/ML IJ SOLN
INTRAMUSCULAR | Status: AC
  Filled 2020-04-09: qty 1

## 2020-04-09 MED ORDER — SUCCINYLCHOLINE CHLORIDE 200 MG/10ML IV SOSY
PREFILLED_SYRINGE | INTRAVENOUS | Status: AC
  Filled 2020-04-09: qty 10

## 2020-04-09 MED ORDER — FENTANYL CITRATE (PF) 100 MCG/2ML IJ SOLN
INTRAMUSCULAR | Status: AC
  Filled 2020-04-09: qty 2

## 2020-04-09 MED ORDER — CEFAZOLIN SODIUM-DEXTROSE 2-4 GM/100ML-% IV SOLN
INTRAVENOUS | Status: AC
  Filled 2020-04-09: qty 100

## 2020-04-09 MED ORDER — OXYCODONE HCL 5 MG PO TABS
5.0000 mg | ORAL_TABLET | Freq: Once | ORAL | Status: AC | PRN
  Administered 2020-04-09: 5 mg via ORAL

## 2020-04-09 SURGICAL SUPPLY — 56 items
ADH SKN CLS APL DERMABOND .7 (GAUZE/BANDAGES/DRESSINGS) ×1
APL PRP STRL LF DISP 70% ISPRP (MISCELLANEOUS) ×1
APPLIER CLIP 9.375 MED OPEN (MISCELLANEOUS)
APR CLP MED 9.3 20 MLT OPN (MISCELLANEOUS)
BINDER BREAST LRG (GAUZE/BANDAGES/DRESSINGS) ×3 IMPLANT
BINDER BREAST MEDIUM (GAUZE/BANDAGES/DRESSINGS) IMPLANT
BINDER BREAST XLRG (GAUZE/BANDAGES/DRESSINGS) IMPLANT
BINDER BREAST XXLRG (GAUZE/BANDAGES/DRESSINGS) IMPLANT
BLADE SURG 15 STRL LF DISP TIS (BLADE) ×1 IMPLANT
BLADE SURG 15 STRL SS (BLADE) ×3
CANISTER SUCT 1200ML W/VALVE (MISCELLANEOUS) ×3 IMPLANT
CHLORAPREP W/TINT 26 (MISCELLANEOUS) ×3 IMPLANT
CLIP APPLIE 9.375 MED OPEN (MISCELLANEOUS) IMPLANT
COVER BACK TABLE 60X90IN (DRAPES) ×3 IMPLANT
COVER MAYO STAND STRL (DRAPES) ×3 IMPLANT
COVER SURGICAL LIGHT HANDLE (MISCELLANEOUS) ×3 IMPLANT
COVER WAND RF STERILE (DRAPES) IMPLANT
DECANTER SPIKE VIAL GLASS SM (MISCELLANEOUS) ×3 IMPLANT
DERMABOND ADVANCED (GAUZE/BANDAGES/DRESSINGS) ×2
DERMABOND ADVANCED .7 DNX12 (GAUZE/BANDAGES/DRESSINGS) ×1 IMPLANT
DRAPE LAPAROSCOPIC ABDOMINAL (DRAPES) IMPLANT
DRAPE LAPAROTOMY 100X72 PEDS (DRAPES) ×3 IMPLANT
DRAPE UTILITY XL STRL (DRAPES) ×3 IMPLANT
ELECT COATED BLADE 2.86 ST (ELECTRODE) IMPLANT
ELECT REM PT RETURN 9FT ADLT (ELECTROSURGICAL) ×3
ELECTRODE REM PT RTRN 9FT ADLT (ELECTROSURGICAL) ×1 IMPLANT
GLOVE BIOGEL PI IND STRL 7.0 (GLOVE) ×1 IMPLANT
GLOVE BIOGEL PI INDICATOR 7.0 (GLOVE) ×2
GLOVE ECLIPSE 7.5 STRL STRAW (GLOVE) ×3 IMPLANT
GLOVE ECLIPSE 8.0 STRL XLNG CF (GLOVE) ×3 IMPLANT
GLOVE SRG 8 PF TXTR STRL LF DI (GLOVE) ×1 IMPLANT
GLOVE SURG SYN 7.5  E (GLOVE) ×3
GLOVE SURG SYN 7.5 E (GLOVE) ×1 IMPLANT
GLOVE SURG UNDER POLY LF SZ8 (GLOVE) ×3
GOWN STRL REUS W/ TWL LRG LVL3 (GOWN DISPOSABLE) ×2 IMPLANT
GOWN STRL REUS W/ TWL XL LVL3 (GOWN DISPOSABLE) ×1 IMPLANT
GOWN STRL REUS W/TWL LRG LVL3 (GOWN DISPOSABLE) ×6
GOWN STRL REUS W/TWL XL LVL3 (GOWN DISPOSABLE) ×3
KIT MARKER MARGIN INK (KITS) IMPLANT
NEEDLE HYPO 25X1 1.5 SAFETY (NEEDLE) ×3 IMPLANT
NS IRRIG 1000ML POUR BTL (IV SOLUTION) ×3 IMPLANT
PACK BASIN DAY SURGERY FS (CUSTOM PROCEDURE TRAY) ×3 IMPLANT
PENCIL SMOKE EVACUATOR (MISCELLANEOUS) ×3 IMPLANT
SLEEVE SCD COMPRESS KNEE MED (MISCELLANEOUS) ×3 IMPLANT
SPONGE LAP 4X18 RFD (DISPOSABLE) ×3 IMPLANT
STAPLER VISISTAT 35W (STAPLE) IMPLANT
SUT MON AB 4-0 PC3 18 (SUTURE) ×3 IMPLANT
SUT SILK 2 0 SH (SUTURE) IMPLANT
SUT VICRYL 3-0 CR8 SH (SUTURE) ×3 IMPLANT
SYR CONTROL 10ML LL (SYRINGE) ×3 IMPLANT
SYR EAR/ULCER 2OZ (SYRINGE) ×3 IMPLANT
TOWEL GREEN STERILE FF (TOWEL DISPOSABLE) ×6 IMPLANT
TRAY FAXITRON CT DISP (TRAY / TRAY PROCEDURE) IMPLANT
TUBE CONNECTING 20'X1/4 (TUBING) ×1
TUBE CONNECTING 20X1/4 (TUBING) ×2 IMPLANT
YANKAUER SUCT BULB TIP NO VENT (SUCTIONS) ×3 IMPLANT

## 2020-04-09 NOTE — Anesthesia Procedure Notes (Signed)
Procedure Name: LMA Insertion Date/Time: 04/09/2020 2:25 PM Performed by: Ronnette Hila, CRNA Pre-anesthesia Checklist: Patient identified, Emergency Drugs available, Suction available and Patient being monitored Patient Re-evaluated:Patient Re-evaluated prior to induction Oxygen Delivery Method: Circle system utilized Preoxygenation: Pre-oxygenation with 100% oxygen Induction Type: IV induction Ventilation: Mask ventilation without difficulty LMA: LMA inserted LMA Size: 4.0 Number of attempts: 1 Airway Equipment and Method: Bite block Placement Confirmation: positive ETCO2 Tube secured with: Tape Dental Injury: Teeth and Oropharynx as per pre-operative assessment

## 2020-04-09 NOTE — Interval H&P Note (Signed)
History and Physical Interval Note:  04/09/2020 1:50 PM  Jaclyn Tyler  has presented today for surgery, with the diagnosis of MASTITIS.  The various methods of treatment have been discussed with the patient and family. After consideration of risks, benefits and other options for treatment, the patient has consented to  Procedure(s): LEFT BREAST CENTRAL DUCT EXCISION (Left) as a surgical intervention.  The patient's history has been reviewed, patient examined, no change in status, stable for surgery.  I have reviewed the patient's chart and labs.  Questions were answered to the patient's satisfaction.     [name]

## 2020-04-09 NOTE — Discharge Instructions (Signed)
[facility] °Office Phone Number [phone] ° °BREAST BIOPSY/ PARTIAL MASTECTOMY: POST OP INSTRUCTIONS ° °Always review your discharge instruction sheet given to you by the facility where your surgery was performed. ° °IF YOU HAVE DISABILITY OR FAMILY LEAVE FORMS, YOU MUST BRING THEM TO THE OFFICE FOR PROCESSING.  DO NOT GIVE THEM TO YOUR DOCTOR. ° °1. A prescription for pain medication may be given to you upon discharge.  Take your pain medication as prescribed, if needed.  If narcotic pain medicine is not needed, then you may take acetaminophen (Tylenol) or ibuprofen (Advil) as needed. °2. Take your usually prescribed medications unless otherwise directed °3. If you need a refill on your pain medication, please contact your pharmacy.  They will contact our office to request authorization.  Prescriptions will not be filled after 5pm or on week-ends. °4. You should eat very light the first 24 hours after surgery, such as soup, crackers, pudding, etc.  Resume your normal diet the day after surgery. °5. Most patients will experience some swelling and bruising in the breast.  Ice packs and a good support bra will help.  Swelling and bruising can take several days to resolve.  °6. It is common to experience some constipation if taking pain medication after surgery.  Increasing fluid intake and taking a stool softener will usually help or prevent this problem from occurring.  A mild laxative (Milk of Magnesia or Miralax) should be taken according to package directions if there are no bowel movements after 48 hours. °7. Unless discharge instructions indicate otherwise, you may remove your bandages 24-48 hours after surgery, and you may shower at that time.  You may have steri-strips (small skin tapes) in place directly over the incision.  These strips should be left on the skin for 7-10 days.  If your surgeon used skin glue on the incision, you may shower in 24 hours.  The glue will flake off over the  next 2-3 weeks.  Any sutures or staples will be removed at the office during your follow-up visit. °8. ACTIVITIES:  You may resume regular daily activities (gradually increasing) beginning the next day.  Wearing a good support bra or sports bra minimizes pain and swelling.  You may have sexual intercourse when it is comfortable. °a. You may drive when you no longer are taking prescription pain medication, you can comfortably wear a seatbelt, and you can safely maneuver your car and apply brakes. °b. RETURN TO WORK:  ______________________________________________________________________________________ °9. You should see your doctor in the office for a follow-up appointment approximately two weeks after your surgery.  Your doctor’s nurse will typically make your follow-up appointment when she calls you with your pathology report.  Expect your pathology report 2-3 business days after your surgery.  You may call to check if you do not hear from us after three days. °10. OTHER INSTRUCTIONS: _______________________________________________________________________________________________ _____________________________________________________________________________________________________________________________________ °_____________________________________________________________________________________________________________________________________ °_____________________________________________________________________________________________________________________________________ ° °WHEN TO CALL YOUR DOCTOR: °1. Fever over 101.0 °2. Nausea and/or vomiting. °3. Extreme swelling or bruising. °4. Continued bleeding from incision. °5. Increased pain, redness, or drainage from the incision. ° °The clinic staff is available to answer your questions during regular business hours.  Please don’t hesitate to call and ask to speak to one of the nurses for clinical concerns.  If you have a medical emergency, go to the nearest  emergency room or call 911.  A surgeon from [facility] is always on call at the hospital. ° °For further questions, please visit [email or web address]  ° ° ° ° °  Post Anesthesia Home Care Instructions ° °Activity: °Get plenty of rest for the remainder of the day. A responsible individual must stay with you for 24 hours following the procedure.  °For the next 24 hours, DO NOT: °-Drive a car °-Operate machinery °-Drink alcoholic beverages °-Take any medication unless instructed by your physician °-Make any legal decisions or sign important papers. ° °Meals: °Start with liquid foods such as gelatin or soup. Progress to regular foods as tolerated. Avoid greasy, spicy, heavy foods. If nausea and/or vomiting occur, drink only clear liquids until the nausea and/or vomiting subsides. Call your physician if vomiting continues. ° °Special Instructions/Symptoms: °Your throat may feel dry or sore from the anesthesia or the breathing tube placed in your throat during surgery. If this causes discomfort, gargle with warm salt water. The discomfort should disappear within 24 hours. ° °If you had a scopolamine patch placed behind your ear for the management of post- operative nausea and/or vomiting: ° °1. The medication in the patch is effective for 72 hours, after which it should be removed.  Wrap patch in a tissue and discard in the trash. Wash hands thoroughly with soap and water. °2. You may remove the patch earlier than 72 hours if you experience unpleasant side effects which may include dry mouth, dizziness or visual disturbances. °3. Avoid touching the patch. Wash your hands with soap and water after contact with the patch. °  ° °

## 2020-04-09 NOTE — Anesthesia Postprocedure Evaluation (Signed)
Anesthesia Post Note  Patient: Jaclyn Tyler  Procedure(s) Performed: LEFT BREAST CENTRAL DUCT EXCISION (Left Breast)     Patient location during evaluation: PACU Anesthesia Type: General Level of consciousness: awake and alert Pain management: pain level controlled Vital Signs Assessment: post-procedure vital signs reviewed and stable Respiratory status: spontaneous breathing, nonlabored ventilation and respiratory function stable Cardiovascular status: blood pressure returned to baseline and stable Postop Assessment: no apparent nausea or vomiting Anesthetic complications: no   No complications documented.  Last Vitals:  Vitals:   04/09/20 1530 04/09/20 1602  BP: 116/82 100/67  Pulse: 66 61  Resp: 15 16  Temp:  36.5 C  SpO2: 100% 100%    Last Pain:  Vitals:   04/09/20 1602  TempSrc:   PainSc: 4                  Lowella Curb

## 2020-04-09 NOTE — H&P (Signed)
Jaclyn Tyler  Location: [facility] Patient #: [id number] DOB: 1991-03-14 Single / Language: [place] / Race: Black or African American Female  History of Present Illness (Patient words: Patient presents for follow-up of chronic left breast mastitis. The area resolved but recurred at about the 1 o'clock position left breast just adjacent to the nipple areolar complex. She has more soreness and swelling at the site. No redness, fever, or drainage today though. She did not tolerate doxycycline very well. She now is interested in excision of this area and the central ducts associated with it.  The patient is a 29 year old female.   Allergies ( No Known Drug Allergies [01/14/2020]: Allergies Reconciled    Vitals ( 02/29/2020 12:05 PM Weight: 183.38 lb Height: 70in Body Surface Area: 2.01 m Body Mass Index: 26.31 kg/m  Temp.: 97.43F  Pulse: 80 (Regular)  P.OX: 88% (Room air) BP: 106/68(Sitting, Left Arm, Standard)        Physical Exam  General Mental Status-Alert. General Appearance-Consistent with stated age. Hydration-Well hydrated. Voice-Normal.  Breast Note: Area of mild fullness left breast at about 1:00 at the nipple complex. No redness or fluctuance today but tender to touch. Not hot to touch. Sinus tract noted from area toward nipple with mild cosmetic dimpling noted  Neurologic Neurologic evaluation reveals -alert and oriented x 3 with no impairment of recent or remote memory. Mental Status-Normal.    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 We'll place her on Bactrim DS 1 tablet by mouth twice a day for now we'll go ahead and set her up for left breast central duct excision. Risks, benefits and long-term expectations discussed. Risk of bleeding, infection, cosmetic deformity, recurrence, pain, revisional  surgery, discussed. Risk of lumpectomy include bleeding, infection, seroma, more surgery, use of seed/wire, wound care, cosmetic deformity and the need for other treatments, death , blood clots, death. Pt agrees to proceed.  Current Plans Started Bactrim DS 800-160 MG Oral Tablet, 1 (one) Tablet two times daily, #14, 02/29/2020, No Refill. You are being scheduled for surgery- Our schedulers will call you.  You should hear from our office's scheduling department within 5 working days about the location, date, and time of surgery. We try to make accommodations for patient's preferences in scheduling surgery, but sometimes the OR schedule or the surgeon's schedule prevents [place] from making those accommodations.  If you have not heard from our office [phone]) in 5 working days, call the office and ask for your surgeon's nurse.  If you have other questions about your diagnosis, plan, or surgery, call the office and ask for your surgeon's nurse.  Pt Education - [facility] Breast Biopsy HCI: discussed with patient and provided information.

## 2020-04-09 NOTE — Op Note (Signed)
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  She had multiple abscesses in a continue to recur despite maximal medical management.  Imaging shows a chronic fluid collection left breast and sinus tract.  On exam she had what appeared to be chronic mastitis which had cooled off antibiotics but there was a sinus tract to the patient's left upper outer quadrant.  She presents for excision due to recurrent mastitis and chronic sinus tract.The procedure has been discussed with the patient. Alternatives to surgery have been discussed with the patient.  Risks of surgery include bleeding,  Infection,  Seroma formation, death,  and the need for further surgery.   The patient understands and wishes to proceed.  Description of procedure: The patient was met in the holding area and questions were answered.  Left breast was marked as the correct site.  She was then taken back to the operating room.  She was placed supine upon the OR table.  After induction of general esthesia, left breast was prepped and draped in a sterile fashion and timeout was performed.  She received appropriate preoperative antibiotics.  Upon examination she had what appeared to be an area of swelling that did not appear actively infected at about the 1 to 2 o'clock position of the left breast approximately 4 to 5 cm from the edge of the nipple areolar complex.  Local anesthetic was infiltrated along the lateral border of the nipple areolar complex.  Incision was made.  Dissection was carried out laterally and the small fluid collection was  entered with some turbid appearing yellowish fluid.  This was evacuated.  Upon inspection of the area there is some mild chronic inflammatory change.  We then proceeded to dissect toward the central duct region of the nipple.  There was a second fluid collection counter with some yellow turbid fluid.  There is also signs of chronic inflammation.  This area was excised under the nipple areolar complex up to the central duct of the nipple.  All this was passed off the field and sent to pathology.  This was copiously irrigated.  Most of this appeared to be chronic in nature without any signs of acute inflammation and these fluid collections appear to be chronic as well.  After irrigating hemostasis was achieved.  After copious irrigation the wound was closed with 3-0 Vicryl for Monocryl.  Dermabond applied.  All counts found to be correct.  Patient was then awoke extubated taken to recovery in satisfactory condition.

## 2020-04-09 NOTE — Anesthesia Preprocedure Evaluation (Signed)
Anesthesia Evaluation  Patient identified by MRN, date of birth, ID band Patient awake    Reviewed: Allergy & Precautions, NPO status , Patient's Chart, lab work & pertinent test results  Airway Mallampati: II  TM Distance: >3 FB Neck ROM: Full    Dental no notable dental hx. (+) Teeth Intact   Pulmonary neg pulmonary ROS, Patient abstained from smoking.,    Pulmonary exam normal breath sounds clear to auscultation       Cardiovascular negative cardio ROS Normal cardiovascular exam Rhythm:Regular Rate:Normal     Neuro/Psych Anxiety negative neurological ROS  negative psych ROS   GI/Hepatic Neg liver ROS, GERD  ,  Endo/Other  negative endocrine ROS  Renal/GU negative Renal ROS  negative genitourinary   Musculoskeletal negative musculoskeletal ROS (+)   Abdominal   Peds  Hematology  (+) Blood dyscrasia, anemia ,   Anesthesia Other Findings   Reproductive/Obstetrics (+) Pregnancy                             Anesthesia Physical  Anesthesia Plan  ASA: II  Anesthesia Plan: General   Post-op Pain Management:    Induction: Intravenous  PONV Risk Score and Plan: 3 and Treatment may vary due to age or medical condition, Ondansetron, Dexamethasone and Midazolam  Airway Management Planned: LMA  Additional Equipment:   Intra-op Plan:   Post-operative Plan: Extubation in OR  Informed Consent: I have reviewed the patients History and Physical, chart, labs and discussed the procedure including the risks, benefits and alternatives for the proposed anesthesia with the patient or authorized representative who has indicated his/her understanding and acceptance.       Plan Discussed with: Anesthesiologist  Anesthesia Plan Comments:         Anesthesia Quick Evaluation

## 2020-04-09 NOTE — Transfer of Care (Signed)
Immediate Anesthesia Transfer of Care Note  Patient: Jaclyn Tyler  Procedure(s) Performed: LEFT BREAST CENTRAL DUCT EXCISION (Left Breast)  Patient Location: PACU  Anesthesia Type:General  Level of Consciousness: sedated  Airway & Oxygen Therapy: Patient Spontanous Breathing and Patient connected to face mask oxygen  Post-op Assessment: Report given to RN and Post -op Vital signs reviewed and stable  Post vital signs: Reviewed and stable  Last Vitals:  Vitals Value Taken Time  BP    Temp    Pulse    Resp    SpO2      Last Pain:  Vitals:   04/09/20 1251  TempSrc: Oral  PainSc: 0-No pain      Patients Stated Pain Goal: 1 (04/09/20 1251)  Complications: No complications documented.

## 2020-04-10 ENCOUNTER — Encounter (HOSPITAL_BASED_OUTPATIENT_CLINIC_OR_DEPARTMENT_OTHER): Payer: Self-pay | Admitting: Surgery

## 2020-04-11 LAB — SURGICAL PATHOLOGY

## 2020-05-29 ENCOUNTER — Ambulatory Visit (HOSPITAL_COMMUNITY): Payer: Medicaid Other

## 2020-08-30 ENCOUNTER — Emergency Department (HOSPITAL_COMMUNITY): Payer: Medicaid Other

## 2020-08-30 ENCOUNTER — Emergency Department (HOSPITAL_COMMUNITY)
Admission: EM | Admit: 2020-08-30 | Discharge: 2020-08-30 | Disposition: A | Discharge status: Home / Self Care | Payer: Medicaid Other | Attending: Emergency Medicine | Admitting: Emergency Medicine

## 2020-08-30 DIAGNOSIS — M542 Cervicalgia: Secondary | ICD-10-CM | POA: Insufficient documentation

## 2020-08-30 DIAGNOSIS — S4992XA Unspecified injury of left shoulder and upper arm, initial encounter: Secondary | ICD-10-CM | POA: Diagnosis present

## 2020-08-30 DIAGNOSIS — S46912A Strain of unspecified muscle, fascia and tendon at shoulder and upper arm level, left arm, initial encounter: Secondary | ICD-10-CM | POA: Insufficient documentation

## 2020-08-30 DIAGNOSIS — R519 Headache, unspecified: Secondary | ICD-10-CM | POA: Insufficient documentation

## 2020-08-30 DIAGNOSIS — Y92410 Unspecified street and highway as the place of occurrence of the external cause: Secondary | ICD-10-CM | POA: Insufficient documentation

## 2020-08-30 MED ORDER — CYCLOBENZAPRINE HCL 10 MG PO TABS: 10.0000 mg | ORAL_TABLET | Freq: Two times a day (BID) | ORAL | 0 refills | Status: DC | PRN

## 2020-08-30 MED ORDER — IBUPROFEN 800 MG PO TABS: 800.0000 mg | ORAL_TABLET | Freq: Three times a day (TID) | ORAL | 0 refills | Status: AC | PRN

## 2020-08-30 MED ORDER — OXYCODONE-ACETAMINOPHEN 5-325 MG PO TABS
2.0000 | ORAL_TABLET | Freq: Once | ORAL | Status: AC
  Administered 2020-08-30: 2 via ORAL
  Filled 2020-08-30: qty 2

## 2020-08-30 NOTE — ED Notes (Signed)
Pt transported to CT ?
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
## 2020-09-03 ENCOUNTER — Emergency Department (HOSPITAL_COMMUNITY): Payer: Medicaid Other

## 2020-09-03 ENCOUNTER — Ambulatory Visit (HOSPITAL_COMMUNITY)
Admission: EM | Admit: 2020-09-03 | Discharge: 2020-09-03 | Disposition: A | Discharge status: Home / Self Care | Payer: Medicaid Other

## 2020-09-03 ENCOUNTER — Encounter (HOSPITAL_COMMUNITY): Payer: Self-pay | Admitting: Emergency Medicine

## 2020-09-03 ENCOUNTER — Encounter (HOSPITAL_COMMUNITY): Payer: Self-pay

## 2020-09-03 ENCOUNTER — Other Ambulatory Visit: Payer: Self-pay

## 2020-09-03 ENCOUNTER — Emergency Department (HOSPITAL_COMMUNITY)
Admission: EM | Admit: 2020-09-03 | Discharge: 2020-09-04 | Disposition: A | Discharge status: Home / Self Care | Payer: Medicaid Other | Attending: Emergency Medicine | Admitting: Emergency Medicine

## 2020-09-03 DIAGNOSIS — M545 Low back pain, unspecified: Secondary | ICD-10-CM

## 2020-09-03 DIAGNOSIS — G44319 Acute post-traumatic headache, not intractable: Secondary | ICD-10-CM | POA: Insufficient documentation

## 2020-09-03 DIAGNOSIS — R519 Headache, unspecified: Secondary | ICD-10-CM | POA: Diagnosis present

## 2020-09-03 DIAGNOSIS — R52 Pain, unspecified: Secondary | ICD-10-CM

## 2020-09-03 MED ORDER — KETOROLAC TROMETHAMINE 60 MG/2ML IM SOLN
60.0000 mg | Freq: Once | INTRAMUSCULAR | Status: DC
  Filled 2020-09-03: qty 2

## 2020-09-03 MED ORDER — BUTALBITAL-APAP-CAFFEINE 50-325-40 MG PO TABS: 1.0000 | ORAL_TABLET | Freq: Four times a day (QID) | ORAL | 0 refills | Status: AC | PRN
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
## 2020-09-03 NOTE — ED Notes (Signed)
Patient verbalizes understanding of discharge instructions. Opportunity for questioning and answers were provided. Armband removed by staff, pt discharged from ED and ambulated to lobby to return home.   
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
## 2020-09-03 NOTE — Discharge Instructions (Signed)
I think you need to go to the emergency room given this is the worst headache of her life.  Please go directly to [facility] emergency room following visit today.
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
## 2020-09-25 ENCOUNTER — Ambulatory Visit: Payer: Self-pay

## 2020-11-17 ENCOUNTER — Ambulatory Visit: Payer: Self-pay | Admitting: Neurology

## 2020-11-25 ENCOUNTER — Ambulatory Visit (HOSPITAL_COMMUNITY)
Admission: RE | Admit: 2020-11-25 | Discharge: 2020-11-25 | Disposition: A | Discharge status: Home / Self Care | Payer: Medicaid Other | Source: Ambulatory Visit | Attending: Emergency Medicine | Admitting: Emergency Medicine

## 2020-11-25 ENCOUNTER — Other Ambulatory Visit: Payer: Self-pay

## 2020-11-25 ENCOUNTER — Encounter (HOSPITAL_COMMUNITY): Payer: Self-pay

## 2020-11-25 VITALS — BP 103/66 | HR 103 | Temp 99.5°F | Resp 18

## 2020-11-25 DIAGNOSIS — Z202 Contact with and (suspected) exposure to infections with a predominantly sexual mode of transmission: Secondary | ICD-10-CM | POA: Diagnosis not present

## 2020-11-25 LAB — HIV ANTIBODY (ROUTINE TESTING W REFLEX): HIV Screen 4th Generation wRfx: NONREACTIVE

## 2020-11-25 LAB — POCT URINALYSIS DIPSTICK, ED / UC
Glucose, UA: 250 mg/dL — AB
Ketones, ur: 40 mg/dL — AB
Nitrite: POSITIVE — AB
Protein, ur: >=300 mg/dL — AB
Specific Gravity, Urine: <=1.005 (ref 1.005–1.030)
Urobilinogen, UA: >=8 mg/dL (ref 0.0–1.0)
pH: 8.5 — ABNORMAL HIGH (ref 5.0–8.0)
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
## 2020-11-25 NOTE — Discharge Instructions (Addendum)
We will contact you if the results from your lab work are positive and require additional treatment.  The results will also appear in MyChart.   Do not have sex while taking undergoing treatment for STI.  Make sure that all of your partners get tested and treated.   Use a condom or other barrier method for all sexual encounters.    Return or go to the Emergency Department if symptoms worsen or do not improve in the next few days.

## 2020-11-25 NOTE — ED Triage Notes (Signed)
Reports discomfort, no burning.  Patient was notified that she needed to be tested

## 2020-11-26 ENCOUNTER — Telehealth (HOSPITAL_COMMUNITY): Payer: Self-pay | Admitting: Emergency Medicine

## 2020-11-26 LAB — CERVICOVAGINAL ANCILLARY ONLY
Bacterial Vaginitis (gardnerella): POSITIVE — AB
Candida Glabrata: NEGATIVE
Candida Vaginitis: NEGATIVE
Chlamydia: NEGATIVE
Comment: NEGATIVE
Comment: NEGATIVE
Comment: NEGATIVE
Comment: NEGATIVE
Comment: NEGATIVE
Comment: NORMAL
Neisseria Gonorrhea: NEGATIVE
Trichomonas: NEGATIVE

## 2020-11-26 LAB — RPR: RPR Ser Ql: NONREACTIVE

## 2020-11-26 MED ORDER — METRONIDAZOLE 500 MG PO TABS: 500.0000 mg | ORAL_TABLET | Freq: Two times a day (BID) | ORAL | 0 refills | Status: DC

## 2021-05-21 ENCOUNTER — Other Ambulatory Visit: Payer: Self-pay

## 2021-05-21 ENCOUNTER — Encounter (HOSPITAL_COMMUNITY): Payer: Self-pay

## 2021-05-21 ENCOUNTER — Emergency Department (HOSPITAL_COMMUNITY)
Admission: EM | Admit: 2021-05-21 | Discharge: 2021-05-21 | Disposition: A | Discharge status: Home / Self Care | Payer: Medicaid Other | Attending: Emergency Medicine | Admitting: Emergency Medicine

## 2021-05-21 DIAGNOSIS — T2000XA Burn of unspecified degree of head, face, and neck, unspecified site, initial encounter: Secondary | ICD-10-CM | POA: Diagnosis present

## 2021-05-21 DIAGNOSIS — X118XXA Contact with other hot tap-water, initial encounter: Secondary | ICD-10-CM | POA: Diagnosis not present

## 2021-05-21 DIAGNOSIS — T2010XA Burn of first degree of head, face, and neck, unspecified site, initial encounter: Secondary | ICD-10-CM | POA: Diagnosis not present

## 2021-05-21 DIAGNOSIS — Y92219 Unspecified school as the place of occurrence of the external cause: Secondary | ICD-10-CM | POA: Insufficient documentation
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
## 2021-05-21 NOTE — Discharge Instructions (Signed)
Please return to the ED with any new or worsening symptoms such as lip, facial, throat swelling Please apply Neosporin ointment/bacitracin ointment to the tender areas on your face.  This will provide antibiotic coverage Please read the attached instructions on burn care.
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
## 2021-05-22 ENCOUNTER — Ambulatory Visit
Admission: RE | Admit: 2021-05-22 | Discharge: 2021-05-22 | Disposition: A | Discharge status: Home / Self Care | Payer: No Typology Code available for payment source | Attending: Physician Assistant | Admitting: Physician Assistant

## 2021-05-22 ENCOUNTER — Other Ambulatory Visit: Payer: Self-pay | Admitting: Physician Assistant

## 2021-05-22 ENCOUNTER — Ambulatory Visit
Admission: RE | Admit: 2021-05-22 | Discharge: 2021-05-22 | Disposition: A | Discharge status: Home / Self Care | Payer: No Typology Code available for payment source | Source: Ambulatory Visit | Attending: Physician Assistant | Admitting: Physician Assistant

## 2021-05-22 DIAGNOSIS — R52 Pain, unspecified: Secondary | ICD-10-CM | POA: Diagnosis present

## 2023-10-27 ENCOUNTER — Ambulatory Visit (HOSPITAL_COMMUNITY)
Admission: EM | Admit: 2023-10-27 | Discharge: 2023-10-27 | Disposition: A | Discharge status: Home / Self Care | Payer: Self-pay | Attending: Physician Assistant | Admitting: Physician Assistant

## 2023-10-27 ENCOUNTER — Other Ambulatory Visit: Payer: Self-pay

## 2023-10-27 ENCOUNTER — Encounter (HOSPITAL_COMMUNITY): Payer: Self-pay | Admitting: Emergency Medicine

## 2023-10-27 DIAGNOSIS — R0981 Nasal congestion: Secondary | ICD-10-CM

## 2023-10-27 DIAGNOSIS — G44209 Tension-type headache, unspecified, not intractable: Secondary | ICD-10-CM

## 2023-10-27 MED ORDER — NAPROXEN 375 MG PO TABS
375.0000 mg | ORAL_TABLET | Freq: Two times a day (BID) | ORAL | 0 refills | Status: AC
Start: 2023-10-27 — End: ?

## 2023-10-27 MED ORDER — FLUTICASONE PROPIONATE 50 MCG/ACT NA SUSP
1.0000 | Freq: Every day | NASAL | 0 refills | Status: AC
Start: 2023-10-27 — End: ?

## 2023-10-27 MED ORDER — AMOXICILLIN-POT CLAVULANATE 875-125 MG PO TABS: 1.0000 | ORAL_TABLET | Freq: Two times a day (BID) | ORAL | 0 refills | Status: AC

## 2023-10-27 NOTE — Discharge Instructions (Addendum)
 We are treating you for sinus infection.  Start Augmentin  twice daily for 7 days.  Continue using Mucinex and I also recommend starting fluticasone  nasal spray.  You can use nasal saline and sinus rinses for additional symptom relief.  Take Naprosyn  up to twice a day for pain.  Do not take additional NSAIDs with this medication including aspirin, ibuprofen /Advil , naproxen /Aleve .  Make sure you rest and drink plenty of fluid.  If you are not feeling better within 24 hours or if anything worsens and you develop the worst headache of your life, vision change, dizziness, nausea/vomiting you need to go to the emergency room immediately as we discussed.
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
## 2023-10-27 NOTE — ED Provider Notes (Signed)
 [facility]    CSN: [id number] Arrival date & time: 10/27/23  0807      History   Chief Complaint Chief Complaint  Patient presents with   Nasal Congestion    HPI Jaclyn Tyler is a 33 y.o. female.   Patient presents today with a month-long history of sinus congestion symptoms.  She reports that over the past several days she has developed severe headaches with associated nausea and vomiting.  She reports that her pain is rated 8 currently on a 0-10 pain scale but yesterday was 10 on a 0 to pain scale and the worst she is ever experienced.  She does report feeling a decrease sensation in her right face as well as blurred vision in her right eye.  She did have several episodes of nausea and vomiting yesterday but has been able to tolerate oral intake today without difficulty.  She has been taking Tylenol  without improvement.  She does report falling over a week ago but did not hit her head.  She does not take any blood thinning medication.  She has been taking over-the-counter Mucinex without improvement of symptoms.  She reports the headache is bitemporal and described as throbbing/sharp, no aggravating or alleviating factors identified.  She denies any history of migraines or primary headache disorder.  She denies any family history of this condition.  She initially thought symptoms were related to her allergies but feels that they have worsened particularly as she is having the severe headaches.    Past Medical History:  Diagnosis Date   Anemia    Anxiety    Complication of anesthesia    woke up during surgery and had a panic attack   Constipation    GERD (gastroesophageal reflux disease)    Gonorrhea    Panic attacks     There are no active problems to display for this patient.   Past Surgical History:  Procedure Laterality Date   BREAST CYST EXCISION Left 04/09/2020   Procedure: LEFT BREAST CENTRAL DUCT EXCISION;  Surgeon: Vanderbilt Ned, MD;  Location:  [facility];  Service: General;  Laterality: Left;   TUBAL LIGATION Bilateral 04/23/2018   TUBAL LIGATION Bilateral 04/23/2018   Procedure: POST PARTUM TUBAL LIGATION;  Surgeon: Nicholaus Burnard HERO, MD;  Location: [facility];  Service: Gynecology;  Laterality: Bilateral;   WISDOM TOOTH EXTRACTION      OB History     Gravida  6   Para  4   Term  4   Preterm  0   AB  2   Living  4      SAB  2   IAB  0   Ectopic  0   Multiple  0   Live Births  4            Home Medications    Prior to Admission medications   Medication Sig Start Date End Date Taking? Authorizing Provider  amoxicillin -clavulanate (AUGMENTIN ) 875-125 MG tablet Take 1 tablet by mouth every 12 (twelve) hours. 10/27/23  Yes Jayma Volpi K, PA-C  fluticasone  (FLONASE ) 50 MCG/ACT nasal spray Place 1 spray into both nostrils daily. 10/27/23  Yes Arlon Bleier K, PA-C  naproxen  (NAPROSYN ) 375 MG tablet Take 1 tablet (375 mg total) by mouth 2 (two) times daily. 10/27/23  Yes Matisse Salais K, PA-C  ibuprofen  (ADVIL ) 800 MG tablet Take 1 tablet (800 mg total) by mouth every 8 (eight) hours as needed. 08/30/20   Nivia Colon, PA-C  Family History Family History  Problem Relation Age of Onset   Cancer Mother    HIV/AIDS Mother    HIV/AIDS Father    Kidney failure Father     Social History Social History   Tobacco Use   Smoking status: Never   Smokeless tobacco: Never  Vaping Use   Vaping status: Never Used  Substance Use Topics   Alcohol use: Yes    Comment: occ   Drug use: Not Currently    Types: Marijuana     Allergies   Doxycycline, Mushroom extract complex (obsolete), and Other   Review of Systems Review of Systems  Constitutional:  Positive for activity change. Negative for appetite change, fatigue and fever.  HENT:  Positive for congestion and postnasal drip. Negative for sinus pressure, sneezing and sore throat.   Eyes:  Positive for photophobia and visual disturbance.   Respiratory:  Positive for cough. Negative for shortness of breath.   Cardiovascular:  Negative for chest pain.  Gastrointestinal:  Positive for nausea and vomiting. Negative for abdominal pain and diarrhea.  Neurological:  Positive for headaches. Negative for dizziness and light-headedness.     Physical Exam Triage Vital Signs ED Triage Vitals  Encounter Vitals Group     BP 10/27/23 0833 106/67     Girls Systolic BP Percentile --      Girls Diastolic BP Percentile --      Boys Systolic BP Percentile --      Boys Diastolic BP Percentile --      Pulse Rate 10/27/23 0833 73     Resp 10/27/23 0833 20     Temp 10/27/23 0833 98 F (36.7 C)     Temp Source 10/27/23 0833 Oral     SpO2 10/27/23 0833 97 %     Weight --      Height --      Head Circumference --      Peak Flow --      Pain Score 10/27/23 0830 8     Pain Loc --      Pain Education --      Exclude from Growth Chart --    No data found.  Updated Vital Signs BP 106/67 (BP Location: Right Arm) Comment (BP Location): large cuff  Pulse 73   Temp 98 F (36.7 C) (Oral)   Resp 20   LMP 10/25/2023   SpO2 97%   Visual Acuity Right Eye Distance: 20/25 Left Eye Distance: 20/25 Bilateral Distance: 20/25  Right Eye Near:   Left Eye Near:    Bilateral Near:     Physical Exam Vitals reviewed.  Constitutional:      General: She is awake. She is not in acute distress.    Appearance: Normal appearance. She is well-developed. She is not ill-appearing.     Comments: Very pleasant female appears stated age in no acute distress sitting comfortably in exam room  HENT:     Head: Normocephalic and atraumatic. No raccoon eyes, Battle's sign or contusion.     Right Ear: Tympanic membrane, ear canal and external ear normal. No hemotympanum.     Left Ear: Tympanic membrane, ear canal and external ear normal. No hemotympanum.     Mouth/Throat:     Tongue: Tongue does not deviate from midline.     Pharynx: Uvula midline.  Posterior oropharyngeal erythema and postnasal drip present. No oropharyngeal exudate.  Eyes:     Extraocular Movements: Extraocular movements intact.     Conjunctiva/sclera: Conjunctivae normal.  Pupils: Pupils are equal, round, and reactive to light.  Cardiovascular:     Rate and Rhythm: Normal rate and regular rhythm.     Heart sounds: Normal heart sounds, S1 normal and S2 normal. No murmur heard. Pulmonary:     Effort: Pulmonary effort is normal.     Breath sounds: Normal breath sounds. No wheezing, rhonchi or rales.     Comments: Clear to auscultation bilaterally Musculoskeletal:     Comments: Strength 5/5 bilateral upper and lower extremities  Neurological:     General: No focal deficit present.     Mental Status: She is alert and oriented to person, place, and time.     Cranial Nerves: Cranial nerves 2-12 are intact.     Motor: Motor function is intact.     Coordination: Coordination is intact. Rapid alternating movements normal.     Gait: Gait is intact.     Comments: Cranial nerves II through XII grossly intact.  Patient reported subjective decrease sensation of right face but had normal sharp versus dull discrimination.  No additional focal neurological defect on exam.  Psychiatric:        Behavior: Behavior is cooperative.      UC Treatments / Results  Labs (all labs ordered are listed, but only abnormal results are displayed) Labs Reviewed - No data to display  EKG   Radiology No results found.  Procedures Procedures (including critical care time)  Medications Ordered in UC Medications - No data to display  Initial Impression / Assessment and Plan / UC Course  I have reviewed the triage vital signs and the nursing notes.  Pertinent labs & imaging results that were available during my care of the patient were reviewed by me and considered in my medical decision making (see chart for details).     Patient is well-appearing, afebrile, nontoxic,  nontachycardic.  Given her constellation of symptoms initially recommend that she go to the emergency room for further evaluation and management.  Patient reports that she is feeling much better today compared to yesterday so does not think that it is necessary that she go to the emergency room.  We discussed that we do not have the ability to do a CT scan to rule out intracranial cause of her headache and other symptoms but she reports that she is feeling better and so does not think that that is necessary.  She does not have any current alarm symptoms and her physical exam is reassuring.  Her vision was appropriate in clinic.  Given her month-long history of congestion with associated headache we discussed that her symptoms are likely related to a sinus infection.  No indication for viral testing.  She was started on Augmentin  twice daily for 7 days.  Also recommend that she gargle with warm salt water and use nasal saline/sinus rinses for additional symptom relief.  She can continue Mucinex and start fluticasone  nasal spray.  She was given Naprosyn  for pain relief.  We discussed that she should not take additional NSAIDs with this medication.  We discussed that if her symptoms worsen in any way and she develops a worsening of her life, recurrent nausea/vomiting, dizziness, visual disturbance, focal weakness, shortness of breath, worsening cough she needs to be seen emergently.  Strict return precautions given.  Excuse note declined.  Final Clinical Impressions(s) / UC Diagnoses   Final diagnoses:  Sinus congestion  Tension headache     Discharge Instructions      We are treating you  for sinus infection.  Start Augmentin  twice daily for 7 days.  Continue using Mucinex and I also recommend starting fluticasone  nasal spray.  You can use nasal saline and sinus rinses for additional symptom relief.  Take Naprosyn  up to twice a day for pain.  Do not take additional NSAIDs with this medication including  aspirin, ibuprofen /Advil , naproxen /Aleve .  Make sure you rest and drink plenty of fluid.  If you are not feeling better within 24 hours or if anything worsens and you develop the worst headache of your life, vision change, dizziness, nausea/vomiting you need to go to the emergency room immediately as we discussed.     ED Prescriptions     Medication Sig Dispense Auth. Provider   amoxicillin -clavulanate (AUGMENTIN ) 875-125 MG tablet Take 1 tablet by mouth every 12 (twelve) hours. 14 tablet Allysson Rinehimer K, PA-C   fluticasone  (FLONASE ) 50 MCG/ACT nasal spray Place 1 spray into both nostrils daily. 16 g Johnnathan Hagemeister K, PA-C   naproxen  (NAPROSYN ) 375 MG tablet Take 1 tablet (375 mg total) by mouth 2 (two) times daily. 10 tablet Raymona Boss K, PA-C      PDMP not reviewed this encounter.   Sherrell Rocky POUR, PA-C 10/27/23 [id number]
